# Patient Record
Sex: Female | Born: 1935 | Race: White | Hispanic: No | Marital: Married | Smoking: Never smoker
Health system: Southern US, Community
[De-identification: ages and names within clinical notes are randomized; demographics above are authoritative.]

## PROBLEM LIST (undated history)

## (undated) DIAGNOSIS — G4730 Sleep apnea, unspecified: Secondary | ICD-10-CM

## (undated) DIAGNOSIS — F0280 Dementia in other diseases classified elsewhere without behavioral disturbance: Secondary | ICD-10-CM

## (undated) DIAGNOSIS — I10 Essential (primary) hypertension: Secondary | ICD-10-CM

## (undated) DIAGNOSIS — I5032 Chronic diastolic (congestive) heart failure: Secondary | ICD-10-CM

## (undated) DIAGNOSIS — E039 Hypothyroidism, unspecified: Secondary | ICD-10-CM

---

## 2006-09-24 NOTE — Procedures (Signed)
[facility]                          STRESS ECHOCARDIOGRAM REPORT   NAME:   Beth [name], Beth [name]                                SS#:   [id number]   DOB:     11/02/1932                                     AGE:        72   SEX:     F                                           ROOM#:     OP   MR#:    [id number]                                       DATE:   09/24/2006   REFERRING PHYS:   STEVEN HARTLINE, MD                     TAPE/INDEX:   PRETEST DATA:   INDICATION:  Chest pain   MEDS TAKEN:  None   MEDS HELD:  Pravastatin; Advair; Rhinocort   TARGET HEART RATE:  143     85%:  125   RISK FACTORS:  Family history; hyperlipidemia   BASELINE ECG:  Normal sinus rhythm   EXERCISE SUPERVISED BY:  ---   TEST RESULTS:             BRUCE PROTOCOL    STAGE    SPEED (MPH)   GRADE (%)    TIME (MIN:SEC)     HR       BP   Resting                                                  72     125/60      1          1.7           10            3:00         114     142/76      2          2.5           12            3:00         131     158/72      3          3.4           14            1:00         139     ---/---      4            4.2           16            3:00         ---     ---/---      5          5.0           18            3:00         ---     ---/---      6          5.5           20            3:00         ---     ---/---      7          6.0           22            3:00         ---     ---/---   Recovery                               Immediate       ---     ---/---                                             2:00          99     149/60                                             4:00          82     147/58                                             6:00          78     128/61   REASON FOR STOPPING:  Achieved target heart rate              TOTAL   EXERCISE TIME:  7:00   ACHIEVED HEART RATE:  139 (97%  max HR)                       EST. METS:   ---    HR RESPONSE:  Normal                                          PEAK RPP:   ---   BP RESPONSE:  Normal                                          CHEST PAIN:   None   OBSERVED DYSRHYTHMIAS:    None   ST SEGMENT CHANGES:  None                                 WALL MOTION ANALYSIS   LV WALL SEGMENT             PRE-EXERCISE             POST-EXERCISE   Basal Anteroseptal             Normal                 Hyperkinesis   Basal Septal                   Normal                 Hyperkinesis   Basal Inferoseptal             Normal                 Hyperkinesis   Basal Posterior                Normal                 Hyperkinesis   Basal Lateral                  Normal                 Hyperkinesis   Basal Anterior                 Normal                 Hyperkinesis   Mid Anteroseptal               Normal                 Hyperkinesis   Mid Septal                     Normal                 Hyperkinesis   Mid Inferoseptal               Normal                 Hyperkinesis   Mid Posterior                  Normal                 Hyperkinesis   Mid Lateral                    Normal                 Hyperkinesis   Mid Anterior                   Normal                 Hyperkinesis   Apical Septal                  Normal                 Hyperkinesis   Apical Inferior                Normal                   Hyperkinesis   Apical Lateral                 Normal                 Hyperkinesis   Apical Anterior                Normal                 Hyperkinesis   LV  Chamber Size                                        Smaller   ECG INTERPRETATION:  Negative by ECG criteria.   ECHO INTERPRETATION:  Normal.   OVERALL IMPRESSION:  Negative for ischemia.   ECG AND ECHO INTERPRETATION BY :                                          JESSE W. ST. CLAIR, III, M.D.   sp1  D: 09/24/2006  [name]: 09/24/2006  1:01 P   cc:    JESSE W. ST. CLAIR, III, M.D.

## 2006-09-24 NOTE — Procedures (Signed)
[facility]                          STRESS ECHOCARDIOGRAM REPORT   NAME:   Beth [name], Beth [name]                                SS#:   [id number]   DOB:     03-Jun-1932                                     AGE:        72   SEX:     F                                           ROOM#:     OP   MR#:    [id number]                                       DATE:   09/24/2006   REFERRING PHYS:   Azucena Fallen, MD                     TAPE/INDEX:   PRETEST DATA:   INDICATION:  Chest pain   MEDS TAKEN:  None   MEDS HELD:  Pravastatin; Advair; Rhinocort   TARGET HEART RATE:  143     85%:  125   RISK FACTORS:  Family history; hyperlipidemia   BASELINE ECG:  Normal sinus rhythm   EXERCISE SUPERVISED BY:  ---   TEST RESULTS:             BRUCE PROTOCOL    STAGE    SPEED (MPH)   GRADE (%)    TIME (MIN:SEC)     HR       BP   Resting                                                  72     125/60      1          1.7           10            3:00         114     142/76      2          2.5           12            3:00         131     158/72      3          3.4           14            1:00         139     ---/---      4  4.2           16            3:00         ---     ---/---      5          5.0           18            3:00         ---     ---/---      6          5.5           20            3:00         ---     ---/---      7          6.0           22            3:00         ---     ---/---   Recovery                               Immediate       ---     ---/---                                             2:00          99     149/60                                             4:00          82     147/58                                             6:00          78     128/61   REASON FOR STOPPING:  Achieved target heart rate              TOTAL   EXERCISE TIME:  7:00   ACHIEVED HEART RATE:  139 (97%  max HR)                       EST. METS:   ---    HR RESPONSE:  Normal                                          PEAK RPP:   ---   BP RESPONSE:  Normal                                          CHEST PAIN:   None   OBSERVED DYSRHYTHMIAS:  None   ST SEGMENT CHANGES:  None                                 WALL MOTION ANALYSIS   LV WALL SEGMENT             PRE-EXERCISE             POST-EXERCISE   Basal Anteroseptal             Normal                 Hyperkinesis   Basal Septal                   Normal                 Hyperkinesis   Basal Inferoseptal             Normal                 Hyperkinesis   Basal Posterior                Normal                 Hyperkinesis   Basal Lateral                  Normal                 Hyperkinesis   Basal Anterior                 Normal                 Hyperkinesis   Mid Anteroseptal               Normal                 Hyperkinesis   Mid Septal                     Normal                 Hyperkinesis   Mid Inferoseptal               Normal                 Hyperkinesis   Mid Posterior                  Normal                 Hyperkinesis   Mid Lateral                    Normal                 Hyperkinesis   Mid Anterior                   Normal                 Hyperkinesis   Apical Septal                  Normal                 Hyperkinesis   Apical Inferior                Normal  Hyperkinesis   Apical Lateral                 Normal                 Hyperkinesis   Apical Anterior                Normal                 Hyperkinesis   LV  Chamber Size                                        Smaller   ECG INTERPRETATION:  Negative by ECG criteria.   ECHO INTERPRETATION:  Normal.   OVERALL IMPRESSION:  Negative for ischemia.   ECG AND ECHO INTERPRETATION BY :                                          Ola Spurr, M.D.   Catalina Gravel  D: 09/24/2006  T: 09/24/2006  1:01 P   cc:    Reola Mosher, III, M.D.

## 2006-09-24 NOTE — Procedures (Signed)
[facility]                          STRESS ECHOCARDIOGRAM REPORT   NAME:   Beth [name], Beth [name]                                SS#:   [id number]   DOB:     12/20/1931                                     AGE:        72   SEX:     F                                           ROOM#:     OP   MR#:    [id number]                                       DATE:   09/24/2006   REFERRING PHYS:   STEVEN HARTLINE, MD                     TAPE/INDEX:   PRETEST DATA:   INDICATION:  Chest pain   MEDS TAKEN:  None   MEDS HELD:  Pravastatin; Advair; Rhinocort   TARGET HEART RATE:  143     85%:  125   RISK FACTORS:  Family history; hyperlipidemia   BASELINE ECG:  Normal sinus rhythm   EXERCISE SUPERVISED BY:  ---   TEST RESULTS:             BRUCE PROTOCOL    STAGE    SPEED (MPH)   GRADE (%)    TIME (MIN:SEC)     HR       BP   Resting                                                  72     125/60      1          1.7           10            3:00         114     142/76      2          2.5           12            3:00         131     158/72      3          3.4           14            1:00         139     ---/---      4            4.2           16            3:00         ---     ---/---      5          5.0           18            3:00         ---     ---/---      6          5.5           20            3:00         ---     ---/---      7          6.0           22            3:00         ---     ---/---   Recovery                               Immediate       ---     ---/---                                             2:00          99     149/60                                             4:00          82     147/58                                             6:00          78     128/61   REASON FOR STOPPING:  Achieved target heart rate              TOTAL   EXERCISE TIME:  7:00   ACHIEVED HEART RATE:  139 (97%  max HR)                       EST. METS:   ---   HR RESPONSE:  Normal                                           PEAK RPP:   ---   BP RESPONSE:  Normal                                          CHEST PAIN:   None   OBSERVED DYSRHYTHMIAS:    None   ST SEGMENT CHANGES:  None                                 WALL MOTION ANALYSIS   LV WALL SEGMENT             PRE-EXERCISE             POST-EXERCISE   Basal Anteroseptal             Normal                 Hyperkinesis   Basal Septal                   Normal                 Hyperkinesis   Basal Inferoseptal             Normal                 Hyperkinesis   Basal Posterior                Normal                 Hyperkinesis   Basal Lateral                  Normal                 Hyperkinesis   Basal Anterior                 Normal                 Hyperkinesis   Mid Anteroseptal               Normal                 Hyperkinesis   Mid Septal                     Normal                 Hyperkinesis   Mid Inferoseptal               Normal                 Hyperkinesis   Mid Posterior                  Normal                 Hyperkinesis   Mid Lateral                    Normal                 Hyperkinesis   Mid Anterior                   Normal                 Hyperkinesis   Apical Septal                  Normal                 Hyperkinesis   Apical Inferior                Normal                   Hyperkinesis   Apical Lateral                 Normal                 Hyperkinesis   Apical Anterior                Normal                 Hyperkinesis   LV  Chamber Size                                        Smaller   ECG INTERPRETATION:  Negative by ECG criteria.   ECHO INTERPRETATION:  Normal.   OVERALL IMPRESSION:  Negative for ischemia.   ECG AND ECHO INTERPRETATION BY :                                          JESSE W. ST. CLAIR, III, M.D.   sp1  D: 09/24/2006  [name]: 09/24/2006  1:01 P   cc:    JESSE W. ST. CLAIR, III, M.D.

## 2006-09-24 NOTE — Procedures (Signed)
[facility]                          STRESS ECHOCARDIOGRAM REPORT   NAME:   Beth [name], Beth [name]                                SS#:   [id number]   DOB:     June 22, 1932                                     AGE:        72   SEX:     F                                           ROOM#:     OP   MR#:    [id number]                                       DATE:   09/24/2006   REFERRING PHYS:   Azucena Fallen, MD                     TAPE/INDEX:   PRETEST DATA:   INDICATION:  Chest pain   MEDS TAKEN:  None   MEDS HELD:  Pravastatin; Advair; Rhinocort   TARGET HEART RATE:  143     85%:  125   RISK FACTORS:  Family history; hyperlipidemia   BASELINE ECG:  Normal sinus rhythm   EXERCISE SUPERVISED BY:  ---   TEST RESULTS:             BRUCE PROTOCOL    STAGE    SPEED (MPH)   GRADE (%)    TIME (MIN:SEC)     HR       BP   Resting                                                  72     125/60      1          1.7           10            3:00         114     142/76      2          2.5           12            3:00         131     158/72      3          3.4           14            1:00         139     ---/---      4  4.2           16            3:00         ---     ---/---      5          5.0           18            3:00         ---     ---/---      6          5.5           20            3:00         ---     ---/---      7          6.0           22            3:00         ---     ---/---   Recovery                               Immediate       ---     ---/---                                             2:00          99     149/60                                             4:00          82     147/58                                             6:00          78     128/61   REASON FOR STOPPING:  Achieved target heart rate              TOTAL   EXERCISE TIME:  7:00   ACHIEVED HEART RATE:  139 (97%  max HR)                       EST. METS:   ---   HR RESPONSE:  Normal                                           PEAK RPP:   ---   BP RESPONSE:  Normal                                          CHEST PAIN:   None   OBSERVED DYSRHYTHMIAS:  None   ST SEGMENT CHANGES:  None                                 WALL MOTION ANALYSIS   LV WALL SEGMENT             PRE-EXERCISE             POST-EXERCISE   Basal Anteroseptal             Normal                 Hyperkinesis   Basal Septal                   Normal                 Hyperkinesis   Basal Inferoseptal             Normal                 Hyperkinesis   Basal Posterior                Normal                 Hyperkinesis   Basal Lateral                  Normal                 Hyperkinesis   Basal Anterior                 Normal                 Hyperkinesis   Mid Anteroseptal               Normal                 Hyperkinesis   Mid Septal                     Normal                 Hyperkinesis   Mid Inferoseptal               Normal                 Hyperkinesis   Mid Posterior                  Normal                 Hyperkinesis   Mid Lateral                    Normal                 Hyperkinesis   Mid Anterior                   Normal                 Hyperkinesis   Apical Septal                  Normal                 Hyperkinesis   Apical Inferior                Normal  Hyperkinesis   Apical Lateral                 Normal                 Hyperkinesis   Apical Anterior                Normal                 Hyperkinesis   LV  Chamber Size                                        Smaller   ECG INTERPRETATION:  Negative by ECG criteria.   ECHO INTERPRETATION:  Normal.   OVERALL IMPRESSION:  Negative for ischemia.   ECG AND ECHO INTERPRETATION BY :                                          Ola Spurr, M.D.   Catalina Gravel  D: 09/24/2006  T: 09/24/2006  1:01 P   cc:    Reola Mosher, III, M.D.

## 2006-10-14 NOTE — Procedures (Signed)
[facility]                           RESPIRATORY CARE DEPARTMENT                             PULMONARY FUNCTION TEST   NAME:  Beth [name], Beth [name]                           ROOM:           DATE:   10/14/2006   SS#:  [id number]                                 MR#:  [id number]   BILLING#:  [id number]   REFERRING PHYSICIAN:    Dr. Nedeem Inayat   COMMENTS:   Examination of the expiratory spirogram reveals a smooth curve and good   effort.  The forced expiratory time is greater than six seconds and the   terminal plateau is reached.   There is a slight downward concavity of the expiratory side of the flow   volume loop suggestive of early air flow obstruction.   Forced vital capacity and FEV1 are normal.  The ratio of FEV1/FVC is at the   lower limits of normal. Maximal voluntary ventilation is normal.  There is   no change in measured flow parameters after administration of inhaled   bronchodilator.   Total lung capacity is normal.  Functional residual capacity and residual   volume are both moderately increased.  The ratio of RV/TLC is minimally   increased.   Airway resistance is 1-1/2 times normal.   Diffusion capacity for carbon monoxide corrected for hemoglobin of 12.2 and   a normal carboxy hemoglobin level is moderately reduced.   This implies a   loss of pulmonary vascular bed and/or parenchyma.   IMPRESSION:      1. Normal forced vital capacity without evidence of airflow obstruction         by GOLD criteria.      2. Normal TLC with slight increased RV/TLC ratio.      3. Moderate reduction of diffusion capacity implying loss of pulmonary         vascular bed and/or parenchyma.   ___________________________________________   STEVEN H. KAUFMAN MD, M.D.   ak  D: 10/14/2006  T: 10/14/2006  7:21 P   [id number]   cc:   STEVEN H. KAUFMAN MD, M.D.

## 2006-10-14 NOTE — Procedures (Signed)
[facility]                           RESPIRATORY CARE DEPARTMENT                             PULMONARY FUNCTION TEST   NAME:  [name], Beth                           ROOM:           DATE:   10/14/2006   SS#:  [id number]                                 MR#:  [id number]   BILLING#:  [id number]   REFERRING PHYSICIAN:    Dr. Nicolasa Ducking   COMMENTS:   Examination of the expiratory spirogram reveals a smooth curve and good   effort.  The forced expiratory time is greater than six seconds and the   terminal plateau is reached.   There is a slight downward concavity of the expiratory side of the flow   volume loop suggestive of early air flow obstruction.   Forced vital capacity and FEV1 are normal.  The ratio of FEV1/FVC is at the   lower limits of normal. Maximal voluntary ventilation is normal.  There is   no change in measured flow parameters after administration of inhaled   bronchodilator.   Total lung capacity is normal.  Functional residual capacity and residual   volume are both moderately increased.  The ratio of RV/TLC is minimally   increased.   Airway resistance is 1-1/2 times normal.   Diffusion capacity for carbon monoxide corrected for hemoglobin of 12.2 and   a normal carboxy hemoglobin level is moderately reduced.   This implies a   loss of pulmonary vascular bed and/or parenchyma.   IMPRESSION:      1. Normal forced vital capacity without evidence of airflow obstruction         by GOLD criteria.      2. Normal TLC with slight increased RV/TLC ratio.      3. Moderate reduction of diffusion capacity implying loss of pulmonary         vascular bed and/or parenchyma.   ___________________________________________   Thornton Dales MD, M.D.   ak  D: 10/14/2006  T: 10/14/2006  7:21 P   [id number]   cc:   Thornton Dales MD, M.D.

## 2016-03-27 ENCOUNTER — Inpatient Hospital Stay
Admit: 2016-03-27 | Discharge: 2016-03-27 | Disposition: A | Discharge status: Home / Self Care | Payer: MEDICARE | Attending: Emergency Medical Services

## 2016-03-27 ENCOUNTER — Emergency Department: Admit: 2016-03-27 | Payer: MEDICARE | Primary: Family Medicine

## 2016-03-27 DIAGNOSIS — R55 Syncope and collapse: Secondary | ICD-10-CM

## 2016-03-27 LAB — EKG, 12 LEAD, INITIAL
Atrial Rate: 75 {beats}/min
Calculated P Axis: 72 degrees
Calculated R Axis: 14 degrees
Calculated T Axis: 41 degrees
P-R Interval: 160 ms
Q-T Interval: 390 ms
QRS Duration: 76 ms
QTC Calculation (Bezet): 435 ms
Ventricular Rate: 75 {beats}/min

## 2016-03-27 LAB — POC URINE MACROSCOPIC
Bilirubin: NEGATIVE
Glucose: NEGATIVE mg/dl
Ketone: NEGATIVE mg/dl
Nitrites: NEGATIVE
Specific gravity: 1.015 (ref 1.005–1.030)
Urobilinogen: 0.2 EU/dl (ref 0.0–1.0)
pH (UA): 7 (ref 5–9)

## 2016-03-27 LAB — METABOLIC PANEL, COMPREHENSIVE
ALT (SGPT): 20 U/L (ref 12–78)
AST (SGOT): 22 U/L (ref 15–37)
Albumin: 3.5 gm/dl (ref 3.4–5.0)
Alk. phosphatase: 97 U/L (ref 45–117)
BUN: 15 mg/dl (ref 7–25)
Bilirubin, total: 0.4 mg/dl (ref 0.2–1.0)
CO2: 27 mEq/L (ref 21–32)
Calcium: 9 mg/dl (ref 8.5–10.1)
Chloride: 103 mEq/L (ref 98–107)
Creatinine: 1.3 mg/dl (ref 0.6–1.3)
GFR est AA: 51
GFR est non-AA: 42
Glucose: 172 mg/dl — ABNORMAL HIGH (ref 74–106)
Potassium: 4.1 mEq/L (ref 3.5–5.1)
Protein, total: 7.6 gm/dl (ref 6.4–8.2)
Sodium: 138 mEq/L (ref 136–145)

## 2016-03-27 LAB — CBC WITH AUTOMATED DIFF
BASOPHILS: 0.7 % (ref 0–3)
EOSINOPHILS: 1.2 % (ref 0–5)
HCT: 37.1 % (ref 37.0–50.0)
HGB: 12.4 gm/dl — ABNORMAL LOW (ref 13.0–17.2)
IMMATURE GRANULOCYTES: 0.4 % (ref 0.0–3.0)
LYMPHOCYTES: 19.3 % — ABNORMAL LOW (ref 28–48)
MCH: 30.1 pg (ref 25.4–34.6)
MCHC: 33.4 gm/dl (ref 30.0–36.0)
MCV: 90 fL (ref 80.0–98.0)
MONOCYTES: 9.1 % (ref 1–13)
MPV: 10.2 fL — ABNORMAL HIGH (ref 6.0–10.0)
NEUTROPHILS: 69.3 % — ABNORMAL HIGH (ref 34–64)
NRBC: 0 (ref 0–0)
PLATELET: 298 10*3/uL (ref 140–450)
RBC: 4.12 M/uL (ref 3.60–5.20)
RDW-SD: 40.7 (ref 36.4–46.3)
WBC: 8.4 10*3/uL (ref 4.0–11.0)

## 2016-03-27 LAB — TROPONIN I: Troponin-I: <0.015 ng/ml (ref 0.000–0.045)

## 2016-03-27 MED ORDER — SODIUM CHLORIDE 0.9% BOLUS IV
0.9 % | INTRAVENOUS | Status: AC
Start: 2016-03-27 — End: 2016-03-27
  Administered 2016-03-27: 16:00:00 via INTRAVENOUS

## 2016-03-27 MED ORDER — SODIUM CHLORIDE 0.9% BOLUS IV
0.9 % | Freq: Once | INTRAVENOUS | Status: DC
Start: 2016-03-27 — End: 2016-03-27

## 2016-03-27 NOTE — Progress Notes (Signed)
CM was asked by Md to see pt. Dr. Cipriano MilePitrolo would like to admit pt to observation, to find out what may have caused her syncopal episode. Husband and pt  do not want to be admitted to observation, due to possible hospital bill they cannot pay. CM attempted to find out what costs might be for them if she was admitted to observation. Spoke with billing they were unable to tell this CM cost as it would depend on why she was admitted, what procedures and other testing was done. That would be after she was discharged. CM spoke with pt and husband in room. Wife wants to go home, states she did not want to come to hospital. CM explained benefits of staying verses what could happen if she went home. Pt states she is willing to take that chance. Per pt she is 82 years old and if this is the end then I am fine with that. Husband agrees with pt. CM spoke with Dr. Cipriano MilePitrolo informed him of conversation with pt and husband.   Juanetta Beetsonstance Voodre RN

## 2016-03-27 NOTE — ED Notes (Signed)
Spoke w/ JC whitley from lab and informed him that urine needs to be run upstairs.  Will follow up

## 2016-03-27 NOTE — ED Notes (Signed)
Pt arrived via ems c/o dizziness and syncopal episodes this am x 2; 1st was while she was [place] [employer] and 2nd when she tried to stand back up w/ no note of injury at this time. Pt reports diarrhea x 2 this am w/ hx of chronic constipation and took milk of mag last night.  No c/o n/v, fever, neuro changes, sob, or pain/injury at this time.

## 2016-03-27 NOTE — ED Provider Notes (Signed)
[place]  Emergency Department Treatment Report    Patient: Beth [name] Age: 82 y.o. Sex: female    Date of Birth: [birth year] Admit Date: 03/27/2016 PCP: Jerline Pain, MD   MRN: [id number]  CSN: [id number]     Room: ER38/ER38 Time Dictated: 12:04 PM      Chief Complaint   Chief Complaint   Patient presents with   ??? Syncope     Syncope x 2 episodes w/ diarrhea x today         History of Present Illness   82 y.o. female presents to emergency department with syncope ??2 brought in by paramedics. She states that she was walking up the steps at the [place] when she began to feel tired and fatigued. She does not remember anything more other than waking up on the ground was found by multiple people. She was told that she had been not responding for several minutes. She was assisted to a standing position and had a recurrent episode of syncope. In route to the hospital paramedics found blood sugar of 136. There are no reports of seizure like movements seen from witnesses at the scene. She denies history of syncopal episodes in the past, known cardiac disease or history of cardiac arrhythmias. Patient does have hypothyroidism and hyperlipidemia for which takes Synthroid and pravastatin and "memory pill" that she often forgets to take. She does admit that she's been having several days of constipation to recommend magnesium last night with 2 episodes of diarrhea today. She denies blood or black in her bowel movements, anticoagulation use or history of GI bleeds. She denies experiencing chest pain, shortness of breath, cough or palpitations. She denies fevers, nausea or vomiting, urinary symptoms, abdominal pain, chest pain, back pain. She denies any recent change in vision, trouble with speech or ambulation weakness or paresthesias in her upper or lower extremities. She lives at home with her husband and follows with Dr. Letitia Neri as her PCP. She denies pain or injury from fall. She  states that she doesn't quite feel back to normal at this time, describing generalized weakness.    Review of Systems   Constitutional: No fever, chills, or weight loss. + weakness.   Eyes: No blurred vision, double vision.  ENT: No sore throat, congestion or ear pain.  Respiratory: No cough, shortness of breath or wheezing.  Cardiovascular:+ syncope. No chest pain/pressure or palpitations.   Gastrointestinal: +2 loose bm today, no melena, hematochezia, or hematemesis. No adominal pain. No nausea, vomiting.  Genitourinary: No painful urination, frequency, or urgency.  Musculoskeletal: No joint pain or swelling. No boney pain or injury.   Integumentary: No lacerations or abrasions.   Neurological: No headache or dizziness. No extremity weakness or paresthesias.     Past Medical/Surgical History     Past Medical History:   Diagnosis Date   ??? Chronic constipation    ??? Hyperlipidemia    ??? Hypothyroid    ??? Miscarriage      History reviewed. No pertinent surgical history.  Denies surgeries    Social History     Social History     Social History   ??? Marital status: MARRIED     Spouse name: N/A   ??? Number of children: N/A   ??? Years of education: N/A     Social History Main Topics   ??? Smoking status: Never Smoker   ??? Smokeless tobacco: None   ??? Alcohol use No   ??? Drug use:  No   ??? Sexual activity: Not Asked     Other Topics Concern   ??? None     Social History Narrative   ??? None     Family History     Family History   Problem Relation Age of Onset   ??? Family history unknown: Yes     Home Medications     Prior to Admission Medications   Prescriptions Last Dose Informant Patient Reported? Taking?   levothyroxine (SYNTHROID) 88 mcg tablet   Yes Yes   Sig: Take 88 mcg by mouth Daily (before breakfast).   pravastatin (PRAVACHOL) 10 mg tablet   Yes Yes   Sig: Take 10 mg by mouth nightly.      Facility-Administered Medications: None     Allergies   No Known Allergies    Physical Exam   ED Triage Vitals   Enc Vitals Group       BP 03/27/16 1102 117/63      Pulse (Heart Rate) 03/27/16 1102 76      Resp Rate 03/27/16 1102 18      Temp 03/27/16 1102 97.9 ??F (36.6 ??C)      Temp src --       O2 Sat (%) 03/27/16 1102 100 %      Weight 03/27/16 1102 119 lb      Height 03/27/16 1102 5' 3"       Head Cir --       Peak Flow --       Pain Score --       Pain Loc --       Pain Edu? --       Excl. in [place]? --      General: Patient appears well developed and well nourished.   HEENT: Conjunctiva clear. PERRL. EOMs intact. TMs clear without injection or hemotympanum. Mucous membranes moist, non-erythematous. No battle sign, raccoon eyes, step-off deformity, or scalp contusion.  Neck: No spinal midline tenderness to palpation. Supple, symmetrical. Mild lateral discomfort with range of motion of the neck, without midline TTP.  Respiratory: Lungs clear to auscultation, nonlabored respirations. No wheezes, rhonchi, or rales.  Cardiovascular: Heart regular rate and rhythm without murmur, rubs or gallops.   Gastrointestinal:  Abdomen soft, nondistended. No abdominal tenderness to palpation. No ecchymosis on the abdomen or back. No CVA tenderness to palpation.  Musculoskeletal: No pain to palpation or with movement of shoulder, elbow, wrist, hip, knee, ankle bilaterally. No pain to palpation pelvis and entire spinal midline.  Integumentary: No evidence of contusion, laceration, abrasion or ecchymosis seen. Warm and dry without rashes or lesions.  Neurologic: Alert and oriented to name person, place, date of birth, able to name [place] president, name objects in the room, spell world backwards. No facial asymmetry or dysarthria noted. Finger to nose coordination intact bilaterally. Rapid alternating movements with hand flip and heal to shin intact bilaterally. Upper extremity and lower extremity sensation and strength intact bilaterally. +2 over 4 DTR patella bilaterally. Able to move all extremities.  Impression and Management Plan    This is a previously grossly healthy 82 year old female who has experienced 2 episodes of syncope as they're today. She has no known cardiac history or prior cardiac enzymes and EKG and continue to monitor her on the cardiac monitor for evidence of arrhythmias. We will additionally obtain labs to look for subtle evidence of anemia and renal failure collection White abnormalities. We will obtain chest x-ray and urinalysis to look for source of  infection. She has no abdominal or back pain, is not on anticoagulation and not high risk for AAA. She has no head pain or objective neurologic findings to concern for bleed following head injury. At this time we will not image her head for evidence of hemorrhage. We will obtain orthos and give 1 liter of fluids, due to no history of congestive heart failure.  Diagnostic Studies   Lab:   Recent Results (from the past 12 hour(s))   EKG, 12 LEAD, INITIAL    Collection Time: 03/27/16 11:24 AM   Result Value Ref Range    Ventricular Rate 75 BPM    Atrial Rate 75 BPM    P-R Interval 160 ms    QRS Duration 76 ms    Q-T Interval 390 ms    QTC Calculation (Bezet) 435 ms    Calculated P Axis 72 degrees    Calculated R Axis 14 degrees    Calculated T Axis 41 degrees    Diagnosis       Baseline Artifact  Baseline wander  Normal sinus rhythm  Normal ECG  No previous ECGs available  Confirmed by Marrion Coy, M.D., Ellin Saba. ([id number]) on 03/27/2016 12:53:36 PM     TROPONIN I    Collection Time: 03/27/16  1:05 PM   Result Value Ref Range    Troponin-I <0.015 0.000 - 0.045 ng/ml   CBC WITH AUTOMATED DIFF    Collection Time: 03/27/16  1:05 PM   Result Value Ref Range    WBC 8.4 4.0 - 11.0 1000/mm3    RBC 4.12 3.60 - 5.20 M/uL    HGB 12.4 (L) 13.0 - 17.2 gm/dl    HCT 37.1 37.0 - 50.0 %    MCV 90.0 80.0 - 98.0 fL    MCH 30.1 25.4 - 34.6 pg    MCHC 33.4 30.0 - 36.0 gm/dl    PLATELET 298 140 - 450 1000/mm3    MPV 10.2 (H) 6.0 - 10.0 fL    RDW-SD 40.7 36.4 - 46.3      NRBC 0 0 - 0       IMMATURE GRANULOCYTES 0.4 0.0 - 3.0 %    NEUTROPHILS 69.3 (H) 34 - 64 %    LYMPHOCYTES 19.3 (L) 28 - 48 %    MONOCYTES 9.1 1 - 13 %    EOSINOPHILS 1.2 0 - 5 %    BASOPHILS 0.7 0 - 3 %   METABOLIC PANEL, COMPREHENSIVE    Collection Time: 03/27/16  1:05 PM   Result Value Ref Range    Sodium 138 136 - 145 mEq/L    Potassium 4.1 3.5 - 5.1 mEq/L    Chloride 103 98 - 107 mEq/L    CO2 27 21 - 32 mEq/L    Glucose 172 (H) 74 - 106 mg/dl    BUN 15 7 - 25 mg/dl    Creatinine 1.3 0.6 - 1.3 mg/dl    GFR est AA 51.0      GFR est non-AA 42      Calcium 9.0 8.5 - 10.1 mg/dl    AST (SGOT) 22 15 - 37 U/L    ALT (SGPT) 20 12 - 78 U/L    Alk. phosphatase 97 45 - 117 U/L    Bilirubin, total 0.4 0.2 - 1.0 mg/dl    Protein, total 7.6 6.4 - 8.2 gm/dl    Albumin 3.5 3.4 - 5.0 gm/dl     Urine dip in the ED negative for nitrates,  small leukocytes, trace protein, trace intact blood, neg glucose, neg ketones, spec gravity 1.015    EKG- NSR without acute ST/T wave abnormality    Imaging:    Xr Chest Pa Lat    Result Date: 03/27/2016  Indication: Syncopal episode. Hypothyroid.     IMPRESSION: Right middle lobe and lingular bronchiectasis. Comment: PA and lateral views the chest reveal the lungs are hyperaerated. Bronchiectasis is present in the right middle lobe and the lingula. No pulmonary consolidation. The heart is of normal size.     ED Course/ Medical Decision Making   Patient was orthostatic with HR increase from 74 to 116 bpm and blood pressure from 127/64 to 97/73. We will give a total of 1.5 liters in the ED. her Emergency department is negative for evidence of acute infection, renal failure, electrocardiogram abnormalities, cardiac injury based upon cardiac enzymes and EKG. She has been up ambulating to the restroom. Patient was monitored in the emergency department on cardiac monitor without arrhythmia noted. She is overall feeling well. Multiple discussions with ER attending recommending overnight observation and  evaluation due to 2 syncopal episodes at prior to arrival to the emergency department. Patient was informed of risk of cardiac arrhythmias, heart attack and death. Patient is of sound mind with husband present during discussion, both understanding the risk and patient is willing to sign AMA paperwork to leave [place].  Final Diagnosis       ICD-10-CM ICD-9-CM   1. Syncope and collapse R55 780.2       Disposition   Left AMA      The patient was personally evaluated by myself and Dr. Evonnie Pat who agrees with the above assessment and plan    Eliseo Gum, PA  Mar 27, 2016    My signature above authenticates this document and my orders, the final ??  diagnosis (es), discharge prescription (s), and instructions in the Epic ??  record.  If you have any questions please contact [phone].  ??  Nursing notes have been reviewed by the physician/ advanced practice ??  Clinician.

## 2016-03-27 NOTE — Other (Signed)
Urine contamiante. Patient informed. She will follow up with PCP. Chart reviewed by ChadBrittany Sossong PA-C.

## 2016-03-27 NOTE — ED Notes (Addendum)
3:27 PM  03/27/16     Discharge instructions given to patient by Deanna ArtisMichelle Wade, RN (name) with verbalization of understanding. Pt leaving AMA- Dr. Cipriano MilePitrolo already consulted w/ pt and AMA form signed.  Patient accompanied by husband.  Patient discharged with the following prescriptions (none).  Patient discharged to home and taken off unit via w/c without incident   Teofilo PodMegan Kinlaw, RN

## 2016-03-28 LAB — POC URINE MICROSCOPIC

## 2016-03-29 LAB — CULTURE, URINE
CULTURE RESULT: >100000 — AB
Culture result: >100000 — AB

## 2017-05-16 ENCOUNTER — Inpatient Hospital Stay: Admit: 2017-05-16 | Payer: MEDICARE | Primary: Family Medicine

## 2017-05-16 DIAGNOSIS — M542 Cervicalgia: Secondary | ICD-10-CM
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
## 2017-05-24 ENCOUNTER — Inpatient Hospital Stay
Admit: 2017-05-24 | Payer: MEDICARE | Attending: Rehabilitative and Restorative Service Providers" | Primary: Family Medicine

## 2017-05-24 NOTE — Progress Notes (Signed)
PT DAILY TREATMENT NOTE 8-14    Patient Name: Beth [name]  Date:05/24/2017  DOB: [birth year]  [x]   Patient DOB Verified  Payor: AETNA MEDICARE / Plan: [facility] AETNA MEDICARE ADVANTAGE / Product Type: Managed Care Medicare /    In time:857am  Out time:955  Total Treatment Time (min): 58  Total Timed Codes (min): 53  1:1 Treatment Time (min): 53   Visit #: 2 of 8-10    Treatment Area: Cervicalgia [M54.2]    SUBJECTIVE  Pain Level (0-10 scale): 4  Any medication changes, allergies to medications, adverse drug reactions, diagnosis change, or new procedure performed?: [x]  No    []  Yes (see summary sheet for update)  Subjective functional status/changes:   []  No changes reported  "I have done a few exercises'    OBJECTIVE  Modality rationale: decrease inflammation, decrease pain, increase tissue extensibility and increase muscle contraction/control to improve the patient???s ability to perform functional mobility and improve activity  endurance     Min Type Additional Details    []  Estim: [] Att   [] Unatt        [] TENS instruct                  [] IFC  [] Premod   [] NMES                     [] Other:  [] w/US   [] w/ice   [] w/heat  Position:  Location:    []   Traction: []  Cervical       [] Lumbar                       []  Prone          [] Supine                       [] Intermittent   [] Continuous Lbs:  []  before manual  []  after manual    []   Ultrasound: [] Continuous   []  Pulsed                           []   [] Location:  W/cm2:    []   Iontophoresis with dexamethasone         Location: []  Take home patch   []  In clinic   10 []   Ice     [x]   heat  []   Ice massage Position:reclined  Location: c/s    []   Vasopneumatic Device Pressure:       []  lo []  med []  hi   Temperature: []  lo []  med []  hi   []  Skin assessment post-treatment:  [] intact [] redness- no adverse reaction       [] redness ??? adverse reaction:       33/29 min Therapeutic Exercise:  []  See flow sheet :    Rationale: increase ROM, increase strength and improve coordination to improve the patient???s ability to perform functional mobility and improve activity  endurance       15 min Manual Therapy:  SOR, PROM all planes, MFR to R UT/Scalenes   Rationale: decrease pain, increase ROM, increase tissue extensibility, decrease trigger points and increase postural awareness to allow for full c/s AROM for ADLs          x min Patient Education: [x]  Review HEP    []  Progressed/Changed HEP based on:   []  positioning   [x]  body mechanics   []  transfers   []   heat/ice application        Other Objective/Functional Measures:   Initiated POC per protocol  Limitations in L rot and R SB limited  Add Lateral isometrics NV     Pain Level (0-10 scale) post treatment: 2-3    ASSESSMENT/Changes in Function:   Patient did well with initial session with frequent VC/TC for form and decreased UT compensation.Encouraged pt with HEP compliance.    Patient will continue to benefit from skilled PT services to modify and progress therapeutic interventions, address functional mobility deficits, address ROM deficits, address strength deficits, analyze and address soft tissue restrictions, analyze and cue movement patterns, analyze and modify body mechanics/ergonomics, assess and modify postural abnormalities and address imbalance/dizziness to attain remaining goals.     []   See Plan of Care  []   See progress note/recertification  []   See Discharge Summary         Progress towards goals / Updated goals:  ??????Short Term Goals: To be accomplished in ??3????treatments  1. Patient will demonstrate I and compliance with HEP to increase Cervical Spine ROM and scapular strength. " I did a few exercises" encourage pt with daily compliance. 05/24/17  2. Patient will report decreased pain no greater than 1/10 with activity to allow increased tolerance to activity.   ??  ??                 Long Term Goals: To be accomplished in ??4????weeks:   3. Patient will increase Cervical spine AROM to WNL to promote normalized posture  4. Patient will increase Postural musculature strength to 5/5 to allow for return to PLOF  5. Patient will increase FOTO scores from 49/100 to??63/100 to demonstrate increased functional independence.  ??    PLAN  []   Upgrade activities as tolerated     []   Continue plan of care  []   Update interventions per flow sheet       []   Discharge due to:_  []   Other:_      Norris CrossNicole D Shalae Belmonte, PT 05/24/2017  7:09 AM

## 2017-05-27 ENCOUNTER — Inpatient Hospital Stay: Payer: MEDICARE | Attending: Rehabilitative and Restorative Service Providers" | Primary: Family Medicine

## 2017-05-27 DIAGNOSIS — M542 Cervicalgia: Secondary | ICD-10-CM

## 2017-05-31 ENCOUNTER — Inpatient Hospital Stay
Admit: 2017-05-31 | Payer: MEDICARE | Attending: Rehabilitative and Restorative Service Providers" | Primary: Family Medicine

## 2017-05-31 NOTE — Progress Notes (Signed)
PT DAILY TREATMENT NOTE 8-14    Patient Name: Beth [name]  Date:05/31/2017  DOB: 1932/05/17  [x]   Patient DOB Verified  Payor: AETNA MEDICARE / Plan: [facility] AETNA MEDICARE ADVANTAGE / Product Type: Managed Care Medicare /    In time: 902am  Out time: 1000am  Total Treatment Time (min): 58  Total Timed Codes (min): 44  1:1 Treatment Time (min): 25   Visit #: 3 of 8-10    Treatment Area: Cervicalgia [M54.2]    SUBJECTIVE  Pain Level (0-10 scale): 4  Any medication changes, allergies to medications, adverse drug reactions, diagnosis change, or new procedure performed?: [x]  No    []  Yes (see summary sheet for update)  Subjective functional status/changes:   []  No changes reported  "Some days are better than others. Today I am having pain in the forehead and down into my neck "    OBJECTIVE  Modality rationale: decrease inflammation, decrease pain, increase tissue extensibility and increase muscle contraction/control to improve the patient???s ability to perform functional mobility and improve activity  endurance     Min Type Additional Details    []  Estim: [] Att   [] Unatt        [] TENS instruct                  [] IFC  [] Premod   [] NMES                     [] Other:  [] w/US   [] w/ice   [] w/heat  Position:  Location:    []   Traction: []  Cervical       [] Lumbar                       []  Prone          [] Supine                       [] Intermittent   [] Continuous Lbs:  []  before manual  []  after manual    []   Ultrasound: [] Continuous   []  Pulsed                           []   [] Location:  W/cm2:    []   Iontophoresis with dexamethasone         Location: []  Take home patch   []  In clinic   10 []   Ice     [x]   heat  []   Ice massage Position:reclined  Location: c/s    []   Vasopneumatic Device Pressure:       []  lo []  med []  hi   Temperature: []  lo []  med []  hi   []  Skin assessment post-treatment:  [] intact [] redness- no adverse reaction       [] redness ??? adverse reaction:        28/10 min Therapeutic Exercise:  []  See flow sheet :   Rationale: increase ROM, increase strength and improve coordination to improve the patient???s ability to perform functional mobility and improve activity  endurance       15 min Manual Therapy:  SOR, PROM all planes, MFR to R UT/Scalenes, and R SCM    Rationale: decrease pain, increase ROM, increase tissue extensibility, decrease trigger points and increase postural awareness to allow for full c/s AROM for ADLs          x min Patient Education: [x]  Review HEP    []   Progressed/Changed HEP based on:   []  positioning   [x]  body mechanics   []  transfers   []  heat/ice application        Other Objective/Functional Measures:     Continues with Limitations in L rot and R SB limited  Add Lateral isometrics with good tolerance today   Improved AROM following MT    Pain Level (0-10 scale) post treatment: 2-3    ASSESSMENT/Changes in Function:   Patient responded well to MT with decreased c/o HA and neck pain following treatment. Noted SO TrP B and SCM TrP on R with release performed with fair tolerance. Continue to reinforce HEP participation, added retro shoulder rolls for relaxation of UT.    Patient will continue to benefit from skilled PT services to modify and progress therapeutic interventions, address functional mobility deficits, address ROM deficits, address strength deficits, analyze and address soft tissue restrictions, analyze and cue movement patterns, analyze and modify body mechanics/ergonomics, assess and modify postural abnormalities and address imbalance/dizziness to attain remaining goals.     []   See Plan of Care  []   See progress note/recertification  []   See Discharge Summary         Progress towards goals / Updated goals:  ??????Short Term Goals: To be accomplished in ??3????treatments  1. Patient will demonstrate I and compliance with HEP to increase Cervical Spine ROM and scapular strength. " I did a few exercises" encourage pt  with daily compliance. 05/24/17  2. Patient will report decreased pain no greater than 1/10 with activity to allow increased tolerance to activity.   ??  ??                 Long Term Goals: To be accomplished in ??4????weeks:  3. Patient will increase Cervical spine AROM to WNL to promote normalized posture  4. Patient will increase Postural musculature strength to 5/5 to allow for return to PLOF  5. Patient will increase FOTO scores from 49/100 to??63/100 to demonstrate increased functional independence.  ??    PLAN  []   Upgrade activities as tolerated     []   Continue plan of care  []   Update interventions per flow sheet       []   Discharge due to:_  []   Other:_      Norris CrossNicole D Dekendrick Uzelac, PT 05/31/2017  7:09 AM

## 2017-06-04 ENCOUNTER — Inpatient Hospital Stay: Admit: 2017-06-04 | Payer: MEDICARE | Primary: Family Medicine

## 2017-06-04 NOTE — Progress Notes (Signed)
PT DAILY TREATMENT NOTE 8-14    Patient Name: Beth [name]  Date:06/04/2017  DOB: [birth year]  [x]   Patient DOB Verified  Payor: Monia PouchAETNA MEDICARE / Plan: [facility] AETNA MEDICARE ADVANTAGE / Product Type: Managed Care Medicare /    In time:1234  Out time:120  Total Treatment Time (min): 46  Total Timed Codes (min): 38  1:1 Treatment Time (min): 15   Visit #: 4 of 8    Treatment Area: Cervicalgia [M54.2]    SUBJECTIVE  Pain Level (0-10 scale): 1/10  Any medication changes, allergies to medications, adverse drug reactions, diagnosis change, or new procedure performed?: [x]  No    []  Yes (see summary sheet for update)  Subjective functional status/changes:   []  No changes reported  Patient reports inconsistent compliance with HEP     OBJECTIVE  Modality rationale: decrease pain and increase tissue extensibility to improve the patient???s ability to perform AROM of cervical spine for ADLs.    Min Type Additional Details    []  Estim: [] Att   [] Unatt        [] TENS instruct                  [] IFC  [] Premod   [] NMES                     [] Other:  [] w/US   [] w/ice   [] w/heat  Position:  Location:    []   Traction: []  Cervical       [] Lumbar                       []  Prone          [] Supine                       [] Intermittent   [] Continuous Lbs:  []  before manual  []  after manual    []   Ultrasound: [] Continuous   []  Pulsed                           [] 1MHz   [] 3MHz Location:  W/cm2:    []   Iontophoresis with dexamethasone         Location: []  Take home patch   []  In clinic   8 []   Ice     [x]   heat  []   Ice massage Position:supine w 2 towels.   Location:    []   Vasopneumatic Device Pressure:       []  lo []  med []  hi   Temperature: []  lo []  med []  hi   [x]  Skin assessment post-treatment:  [x] intact [] redness- no adverse reaction       [] redness ??? adverse reaction:       23/19 min Therapeutic Exercise:  [x]  See flow sheet : 4 min warm up. 0 1:1   Rationale: increase ROM and increase strength to improve the patient???s  ability to perform ADLs without increased pain       15 min Manual Therapy:  Supine SOR, DTM and Myofascial release to Right scalenes, Right SCM. PROM / Stretching into rotation and lateral flexion.    Rationale: decrease pain, increase ROM, increase tissue extensibility, decrease trigger points and increase postural awareness to perform ADLs with ease.              min Patient Education: [x]  Review HEP    []  Progressed/Changed HEP based on:   []  positioning   []   body mechanics   []  transfers   []  heat/ice application        Other Objective/Functional Measures:   No palpable TrP along Upper Traps.   Continued increased tension along Right Scalenes and Rt SCM  Poor tolerance to Moist heat pack. DC heat as no therapeutic benefit.     Pain Level (0-10 scale) post treatment: 1/10    ASSESSMENT/Changes in Function: Patient is progressing with decreased palpable TrP in UTs and increased ability to tolerate manual therapy without increase in muscular guarding.     Patient will continue to benefit from skilled PT services to modify and progress therapeutic interventions, address ROM deficits, address strength deficits, analyze and address soft tissue restrictions and assess and modify postural abnormalities to attain remaining goals.     []   See Plan of Care  []   See progress note/recertification  []   See Discharge Summary         Progress towards goals / Updated goals:  ??????Short Term Goals: To be accomplished in ??3????treatments  1. Patient will demonstrate I and compliance with HEP to increase Cervical Spine ROM and scapular strength. " I did a few exercises" encourage pt with daily compliance. 05/24/17  2. Patient will report decreased pain no greater than 1/10 with activity to allow increased tolerance to activity. Progressing with decreased reports of pain 1/10 post manual 06/04/17   ????  ????????????????????????????????????Long Term Goals: To be accomplished in ??4????weeks:   3. Patient will increase Cervical spine AROM to WNL to promote normalized posture. Progressing with increased ability to perform Therex. 06/04/17   4. Patient will increase Postural musculature strength to 5/5 to allow for return to PLOF  5. Patient will increase FOTO scores from 49/100 to??63/100 to demonstrate increased functional independence.  ????    PLAN  [x]   Upgrade activities as tolerated     []   Continue plan of care  []   Update interventions per flow sheet       []   Discharge due to:_  [x]   Other:_  Add scapular strengthening therex and scalene stretch NS if tolerated.     Dayle Points, PT 06/04/2017  2:36 PM

## 2017-06-07 ENCOUNTER — Inpatient Hospital Stay: Admit: 2017-06-07 | Payer: MEDICARE | Primary: Family Medicine

## 2017-06-07 NOTE — Progress Notes (Signed)
PT DAILY TREATMENT NOTE 8-14    Patient Name: Beth [name]  Date:06/07/2017  DOB: November 22, 1932  [x]   Patient DOB Verified  Payor: AETNA MEDICARE / Plan: [facility] AETNA MEDICARE ADVANTAGE / Product Type: Managed Care Medicare /    In time:900  Out time:955  Total Treatment Time (min): 55  Total Timed Codes (min): 55  1:1 Treatment Time (min): 25   Visit #: 5 of 8    Treatment Area: Cervicalgia [M54.2]    SUBJECTIVE  Pain Level (0-10 scale): 0/10  Any medication changes, allergies to medications, adverse drug reactions, diagnosis change, or new procedure performed?: [x]  No    []  Yes (see summary sheet for update)  Subjective functional status/changes:   []  No changes reported  Patient reports no pain at beginning of therapy session. Many questions about time it will take to "feel better".     OBJECTIVE    40/35 min Therapeutic Exercise:  [x]  See flow sheet : 5 min warm up. 10 min 1:1   Rationale: increase ROM and increase strength to improve the patient???s ability to tolerate ADLs w ease     15 min Manual Therapy:  Supine SOR, myofascial release to cervical paraspinals, Rt Scalenes, Rt SCM and PROM / Stretching into rotation and lateral flexion. Supine OA nods and cervical retraction.    Rationale: decrease pain, increase ROM, increase tissue extensibility and decrease trigger points to increase ease of performing reaching into shelf and ADLs     min Gait Training:  ___ feet with ___ device on level surfaces with ___ level of assist   Rationale:           min Patient Education: [x]  Review HEP    []  Progressed/Changed HEP based on:   []  positioning   []  body mechanics   []  transfers   []  heat/ice application        Other Objective/Functional Measures:   Patient reports increased pain with scalenes stretch.   Improved tolerance to manual PT with no guarding.   Pain Level (0-10 scale) post treatment: 5/10    ASSESSMENT/Changes in Function: Patient tolerated new therex fair with c/o  pain w scalene stretch. Patient is making slow progress towards goals with increased ease of motion and decreased reports of pain. Will Continue to address scapular and postural dysfunction through therex and manual PT. PT discussed the importance of being compliant with HEP to decrease pain and spasms/TrP.   Patient will continue to benefit from skilled PT services to modify and progress therapeutic interventions, address functional mobility deficits, address ROM deficits, address strength deficits, analyze and address soft tissue restrictions and assess and modify postural abnormalities to attain remaining goals.     []   See Plan of Care  []   See progress note/recertification  []   See Discharge Summary         Progress towards goals / Updated goals:  ??????Short Term Goals: To be accomplished in ??3????treatments  1. Patient will demonstrate I and compliance with HEP to increase Cervical Spine ROM and scapular strength. " I did a few exercises" encourage pt with daily compliance. 05/24/17  2. Patient will report decreased pain no greater than 1/10 with activity to allow increased tolerance to activity. Progressing with decreased reports of pain 1/10 post manual 06/04/17   ????  ????????????????????????????????????Long Term Goals: To be accomplished in ??4????weeks:  3. Patient will increase Cervical spine AROM to WNL to promote normalized posture. Progressing with increased ability to perform Therex. 06/04/17  4. Patient will increase Postural musculature strength to 5/5 to allow for return to PLOF  5. Patient will increase FOTO scores from 49/100 to??63/100 to demonstrate increased functional independence.  ????    PLAN  [x]   Upgrade activities as tolerated     []   Continue plan of care  []   Update interventions per flow sheet       []   Discharge due to:_  []   Other:_      Dayle PointsHaley L Eitan Doubleday, PT 06/07/2017  10:25 AM

## 2017-06-13 ENCOUNTER — Inpatient Hospital Stay: Payer: MEDICARE | Primary: Family Medicine

## 2017-06-19 ENCOUNTER — Inpatient Hospital Stay: Admit: 2017-06-19 | Payer: MEDICARE | Primary: Family Medicine

## 2017-06-19 NOTE — Progress Notes (Signed)
PT DAILY TREATMENT NOTE 8-14    Patient Name: Beth [name]  Date:06/19/2017  DOB: [birth year]  [x]  Patient DOB Verified  Payor: [employer] MEDICARE / Plan: [facility] [employer] MEDICARE ADVANTAGE / Product Type: Managed Care Medicare /    In time:1000  Out time:1045  Total Treatment Time (min): 45  Total Timed Codes (min): 45  1:1 Treatment Time (min): 30   Visit #: 6 of 8    Treatment Area: Cervicalgia [M54.2]    SUBJECTIVE  Pain Level (0-10 scale): 4/10  Any medication changes, allergies to medications, adverse drug reactions, diagnosis change, or new procedure performed?: [x] No    [] Yes (see summary sheet for update)  Subjective functional status/changes:   [] No changes reported  I feel like I am probably about 75% better. I have been doing my stretches at home and they seem to help. I have also noticed that I do not have as many headaches.     OBJECTIVE  30/25 min Therapeutic Exercise:  [x] See flow sheet :5 min warm up NC. 15 min 1:1   Rationale: increase ROM and increase strength to improve the patient???s ability to perform IADLs without increased pain     15 min Manual Therapy:   Supine SOR, myofascial release to cervical paraspinals, Rt Scalenes, Rt SCM and PROM / Stretching into rotation and lateral flexion. Supine OA nods and cervical retraction.    Rationale: decrease pain, increase ROM, increase tissue extensibility and decrease trigger points to improve functional independence with dressing and daily household tasks           5 min w TE min Patient Education: [x] Review HEP    [] Progressed/Changed HEP based on:   [] positioning   [] body mechanics   [] transfers   [] heat/ice application        Other Objective/Functional Measures:   Cervical AROM Flexion 45*, Extension 35*Side bending 45* B, Rotation 60* B  Scapular Strength 4+/5   B shoulder strength 5/5  Decreased muscular guarding and tension though Scalenes, SCM, UT, cervical paraspinals.     Pain Level (0-10 scale) post treatment: 1/10     ASSESSMENT/Changes in Function: Patient has made progress with PT and has met 3/5 goals and progressing to meet all goals. Patient is I and compliant with HEP to increase scapular strength and cervical ROM and is aware of postural needs to decrease forward head and rounded shoulders resting posture. Patient is DC from PT due to progress that has been met at this time with HEP to continue.     []  See Plan of Care  []  See progress note/recertification  [x]  See Discharge Summary         Progress towards goals / Updated goals:  ??????Short Term Goals: To be accomplished in ??3????treatments  1. Patient will demonstrate I and compliance with HEP to increase Cervical Spine ROM and scapular strength.??RESOLVED per patient report. 06/19/2017 HLL   2. Patient will report decreased pain no greater than 1/10 with activity to allow increased tolerance to activity. RESOLVED 1/10 pain level post therex and manual 06/19/2017  ????????????????????????????????????Long Term Goals: To be accomplished in ??4????weeks:  3. Patient will increase Cervical spine AROM to WNL to promote normalized posture. RESOLVED 06/19/2017 HLL   4. Patient will increase Postural musculature strength to 5/5 to allow for return to PLOF Progressing 4+/5 06/19/2017  5. Patient will increase FOTO scores from 49/100 to??63/100 to demonstrate increased functional independence.Partially  resolved 59/100 06/19/2017     PLAN  []  Upgrade activities as tolerated     []  Continue plan of care  []  Update interventions per flow sheet       [x]  Discharge due to:_goals met or progressing to be met w HEP.     Lacretia Nicks, PT 06/19/2017  10:29 AM

## 2017-06-19 NOTE — Progress Notes (Signed)
[place]  [address], [place] [address]   Phone [phone]  Fax [phone]  DISCHARGE SUMMARY FOR PHYSICAL THERAPY          Patient Name: Beth [name] DOB: 07/01/1932   Treatment/Medical Diagnosis: Cervicalgia [M54.2]   Onset Date: 02/25/2017    Referral Source: Marvis Moeller, PA Start of Care [facility]): 05/16/2017   Prior Hospitalization: See Medical History Provider #: [id number]   Prior Level of Function: Functionally Independent    Comorbidities: OA, hearing impairment    Medications: Verified on Patient Summary List   Visits from [facility]: 6 Missed Visits: 1       Summary of Care: Thank you for referring this pleasant patient.  [name] has completed 6 of 8 proposed sessions and has met or progressing to meet all of long term goals. She reports 75% improvement of symptoms with ADLs   Foto Score on evaluation was 49 and on discharge is 59 - indicating overall improvement. Patient is ready to assume I management of home exercise program to assist with keeping pain level down and decrease change of reoccurence of initial symptoms.  Will advise patient that is any additional follow up or recommendation is needed to return to referring physician.    Other Objective/Functional Measures:                  Cervical AROM Flexion 45*, Extension 35*Side bending 45* B, Rotation 60* B (at initial eval, Flexion 45*, Extension 30*, SB Rt 20*, SB left 20* w pain, Rotation Right 50*, Rotation Left 35* w pain)   Scapular Strength 4+/5  (was 4/5 at initial eval)  B shoulder strength 5/5  Decreased muscular guarding and tension though Scalenes, SCM, UT, cervical paraspinals. (muscular spasms and increased tension present at eval )  ??  GOALS/ MEASURE OF PROGRESS:   ??????Short Term Goals: To be accomplished in ??3????treatments  1. Patient will demonstrate I and compliance with HEP to increase Cervical  Spine ROM and scapular strength.??RESOLVED per patient report. 06/19/2017 HLL   2. Patient will report decreased pain no greater than 1/10 with activity to allow increased tolerance to activity. RESOLVED 1/10 pain level post therex and manual 06/19/2017  ????????????????????????????????????Long Term Goals: To be accomplished in ??4????weeks:  3. Patient will increase Cervical spine AROM to WNL to promote normalized posture. RESOLVED 06/19/2017 HLL   4. Patient will increase Postural musculature strength to 5/5 to allow for return to PLOF Progressing 4+/5 06/19/2017  5. Patient will increase FOTO scores from 49/100 to??63/100 to demonstrate increased functional independence.Partially resolved 59/100 06/19/2017   ??    Key Functional Changes/Progress: Patient reports increased ability to lift an object overhead, increased ability to carry items and decreased interruptions in sleeping due to pain.     G-Codes (GP): Carry [id number] Current  CK= 40-59%  [id number] D/C  CK= 40-59%.  The severity rating is based on the Level of Assistance required for Functional Mobility and ADLs.    Assessments/Recommendations: Discontinue therapy. Progressing towards or have reached established goals.  If you have any questions/comments please contact us directly at [phone].   Thank you for allowing [place] to assist in the care of your patient.    Therapist Signature: Lacretia Nicks, PT Date: 06/19/2017   Reporting Period: 6/21-7/25/18 Time: 7:10 PM

## 2017-08-12 ENCOUNTER — Encounter

## 2019-12-12 ENCOUNTER — Other Ambulatory Visit: Payer: Self-pay

## 2019-12-12 ENCOUNTER — Emergency Department (HOSPITAL_COMMUNITY): Payer: Medicare HMO

## 2019-12-12 ENCOUNTER — Encounter (HOSPITAL_COMMUNITY): Payer: Self-pay

## 2019-12-12 ENCOUNTER — Inpatient Hospital Stay (HOSPITAL_COMMUNITY)
Admission: EM | Admit: 2019-12-12 | Discharge: 2019-12-17 | DRG: 177 | Disposition: A | Discharge status: Home Health | Payer: Medicare HMO | Attending: Internal Medicine | Admitting: Internal Medicine

## 2019-12-12 DIAGNOSIS — I10 Essential (primary) hypertension: Secondary | ICD-10-CM | POA: Diagnosis present

## 2019-12-12 DIAGNOSIS — M810 Age-related osteoporosis without current pathological fracture: Secondary | ICD-10-CM | POA: Diagnosis present

## 2019-12-12 DIAGNOSIS — E039 Hypothyroidism, unspecified: Secondary | ICD-10-CM | POA: Diagnosis present

## 2019-12-12 DIAGNOSIS — R2681 Unsteadiness on feet: Secondary | ICD-10-CM | POA: Diagnosis present

## 2019-12-12 DIAGNOSIS — Z79890 Hormone replacement therapy (postmenopausal): Secondary | ICD-10-CM

## 2019-12-12 DIAGNOSIS — J8410 Pulmonary fibrosis, unspecified: Secondary | ICD-10-CM | POA: Diagnosis present

## 2019-12-12 DIAGNOSIS — Z79899 Other long term (current) drug therapy: Secondary | ICD-10-CM

## 2019-12-12 DIAGNOSIS — U071 COVID-19: Secondary | ICD-10-CM

## 2019-12-12 DIAGNOSIS — R262 Difficulty in walking, not elsewhere classified: Secondary | ICD-10-CM | POA: Diagnosis present

## 2019-12-12 DIAGNOSIS — E785 Hyperlipidemia, unspecified: Secondary | ICD-10-CM | POA: Diagnosis present

## 2019-12-12 DIAGNOSIS — G9341 Metabolic encephalopathy: Secondary | ICD-10-CM | POA: Diagnosis present

## 2019-12-12 DIAGNOSIS — I160 Hypertensive urgency: Secondary | ICD-10-CM

## 2019-12-12 DIAGNOSIS — Z833 Family history of diabetes mellitus: Secondary | ICD-10-CM

## 2019-12-12 DIAGNOSIS — J440 Chronic obstructive pulmonary disease with acute lower respiratory infection: Secondary | ICD-10-CM | POA: Diagnosis present

## 2019-12-12 DIAGNOSIS — I161 Hypertensive emergency: Secondary | ICD-10-CM | POA: Diagnosis present

## 2019-12-12 DIAGNOSIS — K219 Gastro-esophageal reflux disease without esophagitis: Secondary | ICD-10-CM | POA: Diagnosis present

## 2019-12-12 DIAGNOSIS — G309 Alzheimer's disease, unspecified: Secondary | ICD-10-CM | POA: Diagnosis present

## 2019-12-12 DIAGNOSIS — J1282 Pneumonia due to coronavirus disease 2019: Secondary | ICD-10-CM

## 2019-12-12 DIAGNOSIS — F0280 Dementia in other diseases classified elsewhere without behavioral disturbance: Secondary | ICD-10-CM

## 2019-12-12 DIAGNOSIS — Z9114 Patient's other noncompliance with medication regimen: Secondary | ICD-10-CM

## 2019-12-12 LAB — CBC WITH DIFFERENTIAL/PLATELET
Abs Immature Granulocytes: 0.04 10*3/uL (ref 0.00–0.07)
Basophils Absolute: 0 10*3/uL (ref 0.0–0.1)
Basophils Relative: 0 %
Eosinophils Absolute: 0.1 10*3/uL (ref 0.0–0.5)
Eosinophils Relative: 1 %
HCT: 38.5 % (ref 36.0–46.0)
Hemoglobin: 12.6 g/dL (ref 12.0–15.0)
Immature Granulocytes: 0 %
Lymphocytes Relative: 19 %
Lymphs Abs: 1.7 10*3/uL (ref 0.7–4.0)
MCH: 29.5 pg (ref 26.0–34.0)
MCHC: 32.7 g/dL (ref 30.0–36.0)
MCV: 90.2 fL (ref 80.0–100.0)
Monocytes Absolute: 1 10*3/uL (ref 0.1–1.0)
Monocytes Relative: 11 %
Neutro Abs: 6.2 10*3/uL (ref 1.7–7.7)
Neutrophils Relative %: 69 %
Platelets: 267 10*3/uL (ref 150–400)
RBC: 4.27 MIL/uL (ref 3.87–5.11)
RDW: 12.8 % (ref 11.5–15.5)
WBC: 9 10*3/uL (ref 4.0–10.5)
nRBC: 0 % (ref 0.0–0.2)

## 2019-12-12 LAB — COMPREHENSIVE METABOLIC PANEL
ALT: 16 U/L (ref 0–44)
AST: 26 U/L (ref 15–41)
Albumin: 4.3 g/dL (ref 3.5–5.0)
Alkaline Phosphatase: 87 U/L (ref 38–126)
Anion gap: 12 (ref 5–15)
BUN: 22 mg/dL (ref 8–23)
CO2: 24 mmol/L (ref 22–32)
Calcium: 9.2 mg/dL (ref 8.9–10.3)
Chloride: 98 mmol/L (ref 98–111)
Creatinine, Ser: 1.03 mg/dL — ABNORMAL HIGH (ref 0.44–1.00)
GFR calc Af Amer: 57 mL/min — ABNORMAL LOW (ref >60)
GFR calc non Af Amer: 50 mL/min — ABNORMAL LOW (ref >60)
Glucose, Bld: 91 mg/dL (ref 70–99)
Potassium: 3.9 mmol/L (ref 3.5–5.1)
Sodium: 134 mmol/L — ABNORMAL LOW (ref 135–145)
Total Bilirubin: 0.8 mg/dL (ref 0.3–1.2)
Total Protein: 7.9 g/dL (ref 6.5–8.1)

## 2019-12-12 LAB — POC SARS CORONAVIRUS 2 AG -  ED: SARS Coronavirus 2 Ag: POSITIVE — AB

## 2019-12-12 LAB — URINALYSIS, ROUTINE W REFLEX MICROSCOPIC
Bilirubin Urine: NEGATIVE
Glucose, UA: NEGATIVE mg/dL
Hgb urine dipstick: NEGATIVE
Ketones, ur: NEGATIVE mg/dL
Leukocytes,Ua: NEGATIVE
Nitrite: NEGATIVE
Protein, ur: NEGATIVE mg/dL
Specific Gravity, Urine: 1.012 (ref 1.005–1.030)
pH: 6 (ref 5.0–8.0)

## 2019-12-12 LAB — TROPONIN I (HIGH SENSITIVITY): Troponin I (High Sensitivity): 5 ng/L (ref <18)

## 2019-12-12 MED ORDER — SODIUM CHLORIDE 0.9 % IV BOLUS
500.0000 mL | Freq: Once | INTRAVENOUS | Status: AC
  Administered 2019-12-12: 500 mL via INTRAVENOUS

## 2019-12-12 MED ORDER — HYDRALAZINE HCL 20 MG/ML IJ SOLN
5.0000 mg | Freq: Once | INTRAMUSCULAR | Status: AC
  Administered 2019-12-13: 5 mg via INTRAVENOUS
  Filled 2019-12-12: qty 1

## 2019-12-12 MED ORDER — SODIUM CHLORIDE 0.9 % IV BOLUS
250.0000 mL | Freq: Once | INTRAVENOUS | Status: AC
  Administered 2019-12-12: 250 mL via INTRAVENOUS

## 2019-12-12 NOTE — ED Notes (Signed)
Patient transported to CT 

## 2019-12-12 NOTE — ED Triage Notes (Signed)
She tells me that she has had a decreased appetite for "about two days". She denies any pain/fever/n/v/d and is in no distress. She does mention a recent move to [place] to live with her son. She also tells me she is not on any medication(s) and she has no known medical and surgical history.

## 2019-12-12 NOTE — ED Provider Notes (Signed)
[facility]-EMERGENCY DEPT Provider Note   CSN: [id number] Arrival date & time: 12/12/19  1623     History Chief Complaint  Patient presents with  . Weakness   Level 5 caveat due to altered mental status  Amanda Sanford is a 86 y.o. female with history of Alzheimer's dementia, pulmonary fibrosis, hyperlipidemia, osteoporosis, COPD, GERD, hypothyroidism presents for evaluation of acute onset, progressively worsening decreased appetite, general malaise for 2 days.  She tells me that she has a frontal headache, no head injury.  She states she has no history of headaches.  Denies vision changes, numbness or weakness of the extremities, fever or neck stiffness.  She does have a persistent cough on my examination, is unsure how long this has been present.  She tells me she recently relocated from [place] 3 months ago to live with her son here.  She is oriented to person and time but not place.  I spoke with the patient's son Joaquina Nissen on the phone with patient's permission.  He notes that she was diagnosed with Alzheimer's dementia around 3 years ago but her symptoms have significantly worsened over the last year.  He notes that over the last 2 years she has been losing weight and has had decreased appetite and decreased oral intake.  She will typically drink a couple of ensures daily but otherwise little oral intake despite encouragement from family. He notes that over the last several days her confusion has worsened and she has not had anything to eat or drink.  She is typically ambulatory without the aid of any assistive devices but they have noted she has been very dizzy with standing and unstable with ambulation.  They noted development of cough around 1 week ago.  She actually moved from [place] 9 or 10 months ago with her husband to live with her son full-time.  Her son notes that patient has been tested positive for Covid 2 weeks ago.  Lady Deutscher FNP is her PCP    The history is provided by the patient and a relative. The history is limited by the condition of the patient.       No past medical history on file.  There are no problems to display for this patient.   OB History   No obstetric history on file.     No family history on file.  Social History   Tobacco Use  . Smoking status: Never Smoker  . Smokeless tobacco: Never Used  Substance Use Topics  . Alcohol use: Never  . Drug use: Never    Home Medications Prior to Admission medications   Not on File    Allergies    Patient has no known allergies.  Review of Systems   Review of Systems  Unable to perform ROS: Mental status change    Physical Exam Updated Vital Signs BP (!) 193/79   Pulse (!) 55   Temp 98.3 F (36.8 C) (Oral)   Resp 16   SpO2 100%   Physical Exam Vitals and nursing note reviewed.  Constitutional:      General: She is not in acute distress.    Appearance: She is well-developed.  HENT:     Head: Normocephalic and atraumatic.  Eyes:     General:        Right eye: No discharge.        Left eye: No discharge.     Extraocular Movements: Extraocular movements intact.     Conjunctiva/sclera: Conjunctivae  normal.     Pupils: Pupils are equal, round, and reactive to light.  Neck:     Vascular: No JVD.     Trachea: No tracheal deviation.  Cardiovascular:     Rate and Rhythm: Normal rate.  Pulmonary:     Effort: Pulmonary effort is normal.     Comments: Frequent dry cough.  Bibasilar crackles noted.  Speaking in full sentences without difficulty Abdominal:     General: Abdomen is flat. Bowel sounds are normal. There is no distension.     Palpations: Abdomen is soft.     Tenderness: There is abdominal tenderness in the right lower quadrant, suprapubic area and left lower quadrant. There is no right CVA tenderness or left CVA tenderness.     Comments: Mild lower abdominal discomfort on palpation  Musculoskeletal:     Cervical back: Neck  supple.  Skin:    General: Skin is warm and dry.     Findings: No erythema.  Neurological:     Mental Status: She is alert.     Sensory: No sensory deficit.     Gait: Gait abnormal.     Comments: Mental Status:  Alert, mildly confused.  Oriented to person and time but not place.  Speech fluent without evidence of aphasia. Able to follow 2 step commands without difficulty.  Cranial Nerves:  II:  Peripheral visual fields grossly normal, pupils equal, round, reactive to light III,IV, VI: ptosis not present, extra-ocular motions intact bilaterally  V,VII: smile symmetric, facial light touch sensation equal VIII: hearing grossly normal to voice  X: uvula elevates symmetrically  XI: bilateral shoulder shrug symmetric and strong XII: midline tongue extension without fassiculations Motor:  Normal tone. 5/5 strength of BUE and BLE major muscle groups including strong and equal grip strength and dorsiflexion/plantar flexion, no pronator drift Sensory: light touch normal in all extremities. Gait: Requires assistance with ambulation due to feeling lightheaded with standing, exhibits some ataxia.  Unable to heel walk or toe walk.  Steadies herself by grabbing onto nearby objects.  Psychiatric:        Behavior: Behavior normal.     ED Results / Procedures / Treatments   Labs (all labs ordered are listed, but only abnormal results are displayed) Labs Reviewed  COMPREHENSIVE METABOLIC PANEL - Abnormal; Notable for the following components:      Result Value   Sodium 134 (*)    Creatinine, Ser 1.03 (*)    GFR calc non Af Amer 50 (*)    GFR calc Af Amer 57 (*)    All other components within normal limits  URINALYSIS, ROUTINE W REFLEX MICROSCOPIC - Abnormal; Notable for the following components:   Bacteria, UA RARE (*)    All other components within normal limits  POC SARS CORONAVIRUS 2 AG -  ED - Abnormal; Notable for the following components:   SARS Coronavirus 2 Ag POSITIVE (*)    All  other components within normal limits  URINE CULTURE  CBC WITH DIFFERENTIAL/PLATELET  TROPONIN I (HIGH SENSITIVITY)    EKG EKG Interpretation  Date/Time:  Saturday December 12 2019 17:17:13 EST Ventricular Rate:  60 PR Interval:    QRS Duration: 84 QT Interval:  488 QTC Calculation: 488 R Axis:   47 Text Interpretation: Sinus rhythm Borderline short PR interval Borderline prolonged QT interval No previous ECGs available Confirmed by Richardean Canal [phone]) on 12/12/2019 8:23:54 PM   Radiology CT Head Wo Contrast  Result Date: 12/12/2019 CLINICAL DATA:  [age]  year female with altered mental status. EXAM: CT HEAD WITHOUT CONTRAST TECHNIQUE: Contiguous axial images were obtained from the base of the skull through the vertex without intravenous contrast. COMPARISON:  None. FINDINGS: Brain: There is mild age-related atrophy and moderate chronic microvascular ischemic changes. There is no acute intracranial hemorrhage. No mass effect or midline shift. No extra-axial fluid collection. Vascular: No hyperdense vessel or unexpected calcification. Skull: Normal. Negative for fracture or focal lesion. Sinuses/Orbits: No acute finding. Other: None IMPRESSION: 1. No acute intracranial hemorrhage. 2. Age-related atrophy and chronic microvascular ischemic changes. Electronically Signed   By: Anner Crete M.D.   On: 12/12/2019 17:40   MR BRAIN WO CONTRAST  Result Date: 12/12/2019 CLINICAL DATA:  Initial evaluation for acute ataxia, stroke suspected. EXAM: MRI HEAD WITHOUT CONTRAST TECHNIQUE: Multiplanar, multiecho pulse sequences of the brain and surrounding structures were obtained without intravenous contrast. COMPARISON:  Prior CT from earlier same day. FINDINGS: Brain: Examination technically limited as the patient was unable to tolerate the full length of the exam. Additionally, images provided are markedly degraded by motion. Generalized age-related cerebral atrophy. Patchy and confluent T2/FLAIR  hyperintensity within the periventricular deep white matter most consistent with chronic small vessel ischemic disease, moderate nature. Multiple scattered superimposed remote lacunar infarcts present within the bilateral basal ganglia. Patchy small vessel changes noted within the pons as well. No abnormal foci of restricted diffusion to suggest acute or subacute ischemia. Gray-white matter differentiation maintained. No areas of remote cortical infarction. No definite evidence for acute intracranial hemorrhage. No mass lesion or midline shift. No hydrocephalus. No extra-axial fluid collection. Pituitary gland grossly normal. Midline structures intact. Vascular: Major intracranial vascular flow voids are grossly maintained at the skull base. Skull and upper cervical spine: Craniocervical junction within normal limits. Bone marrow signal intensity grossly normal. No scalp soft tissue abnormality. Sinuses/Orbits: Patient status post bilateral ocular lens replacement. Paranasal sinuses are grossly clear. No mastoid effusion. Other: None. IMPRESSION: 1. Technically limited exam due to motion artifact and the patient's inability to tolerate the full length of the study. 2. No acute intracranial infarct or other definite abnormality. 3. Age-related cerebral atrophy with moderate chronic microvascular ischemic disease, with multiple superimposed remote lacunar infarcts within the bilateral basal ganglia. Electronically Signed   By: Jeannine Boga M.D.   On: 12/12/2019 22:56   DG Chest Portable 1 View  Result Date: 12/12/2019 CLINICAL DATA:  Weakness and cough. EXAM: PORTABLE CHEST 1 VIEW COMPARISON:  None. FINDINGS: Cardiomediastinal silhouette is normal. Mediastinal contours appear intact. Calcific atherosclerotic disease of the aorta. Nodular opacities in the right upper and lower lung fields may represent pulmonary nodules, pleural scarring or focal airspace consolidation. Osseous structures are without acute  abnormality. Soft tissues are grossly normal. IMPRESSION: 1. Nodular opacities in the right upper and lower lung fields may represent pulmonary nodules, pleural scarring or focal airspace consolidation. 2. Calcific atherosclerotic disease of the aorta. Electronically Signed   By: Fidela Salisbury M.D.   On: 12/12/2019 17:43    Procedures Procedures (including critical care time)  Medications Ordered in ED Medications  sodium chloride 0.9 % bolus 250 mL (0 mLs Intravenous Stopped 12/12/19 1826)  sodium chloride 0.9 % bolus 500 mL (500 mLs Intravenous New Bag/Given 12/12/19 2316)  hydrALAZINE (APRESOLINE) injection 5 mg (5 mg Intravenous Given 12/13/19 0007)    ED Course  I have reviewed the triage vital signs and the nursing notes.  Pertinent labs & imaging results that were available during my care of the  patient were reviewed by me and considered in my medical decision making (see chart for details).    MDM Rules/Calculators/A&P                      Jasmine Mcbeth was evaluated in Emergency Department on 12/13/2019 for the symptoms described in the history of present illness. She was evaluated in the context of the global COVID-19 pandemic, which necessitated consideration that the patient might be at risk for infection with the SARS-CoV-2 virus that causes COVID-19. Institutional protocols and algorithms that pertain to the evaluation of patients at risk for COVID-19 are in a state of rapid change based on information released by regulatory bodies including the CDC and federal and state organizations. These policies and algorithms were followed during the patient's care in the ED.  Patient presenting for evaluation of weakness, difficulty ambulating, decreased oral intake, cough.  She is afebrile, persistently hypertensive in the ED.  She has no history of hypertension.  Vital signs otherwise stable.  She is nontoxic in appearance.  EKG shows normal sinus rhythm and a troponin was obtained  which was negative.  She has no complaint of chest pain and I have a low suspicion of ACS/MI.  Lab work reviewed by me shows no leukocytosis, no anemia, no metabolic derangements.  Creatinine is mildly elevated.  UA is not concerning for UTI or nephrolithiasis.  Point-of-care Covid test is negative.  Her husband tested positive for Covid 2 weeks ago.  Her chest x-ray shows nodular opacities which could be consistent with scarring, pulmonary nodules, or focal airspace disease.  She has a history of pulmonary fibrosis.  She was ambulated in the ED with stable SPO2 saturations.  She has difficulty with ambulating, unsteady gait.  Notes feeling unsteady with ambulation.  She is complaining of a headache and with her hypertension and ataxia head CT was obtained initially which showed no acute intracranial abnormalities, subsequently followed by MRI which showed no evidence of acute infarct.  She does have moderate chronic microvascular ischemic disease and multiple superimposed remote lacunar infarcts within the bilateral basal ganglia.  Given ischemic stroke has been ruled out with MRI, I am concerned that she is experiencing neurologic symptoms in the setting of hypertensive emergency.  We will give IV hydralazine for management of her hypertension.  I spoke with Dr. Park Breed with [facility] hospitalist service who will evaluate the patient emergently in the ED.   Final Clinical Impression(s) / ED Diagnoses Final diagnoses:  Hypertensive emergency  COVID-19    Rx / DC Orders ED Discharge Orders    None       Jeanie Sewer, PA-C 12/13/19 0032    Charlynne Pander, MD 12/13/19 [phone]

## 2019-12-12 NOTE — ED Notes (Addendum)
Pt 02Sat 100 at rest. O2 levels went to 95 while ambulating.

## 2019-12-12 NOTE — ED Notes (Signed)
Pt transported to MRI 

## 2019-12-13 ENCOUNTER — Inpatient Hospital Stay (HOSPITAL_COMMUNITY): Payer: Medicare HMO

## 2019-12-13 DIAGNOSIS — J449 Chronic obstructive pulmonary disease, unspecified: Secondary | ICD-10-CM

## 2019-12-13 DIAGNOSIS — Z9114 Patient's other noncompliance with medication regimen: Secondary | ICD-10-CM | POA: Diagnosis not present

## 2019-12-13 DIAGNOSIS — K219 Gastro-esophageal reflux disease without esophagitis: Secondary | ICD-10-CM | POA: Diagnosis present

## 2019-12-13 DIAGNOSIS — E785 Hyperlipidemia, unspecified: Secondary | ICD-10-CM | POA: Diagnosis present

## 2019-12-13 DIAGNOSIS — F0280 Dementia in other diseases classified elsewhere without behavioral disturbance: Secondary | ICD-10-CM

## 2019-12-13 DIAGNOSIS — G9341 Metabolic encephalopathy: Secondary | ICD-10-CM | POA: Diagnosis present

## 2019-12-13 DIAGNOSIS — G309 Alzheimer's disease, unspecified: Secondary | ICD-10-CM | POA: Diagnosis present

## 2019-12-13 DIAGNOSIS — R2681 Unsteadiness on feet: Secondary | ICD-10-CM | POA: Diagnosis present

## 2019-12-13 DIAGNOSIS — I160 Hypertensive urgency: Secondary | ICD-10-CM

## 2019-12-13 DIAGNOSIS — J8410 Pulmonary fibrosis, unspecified: Secondary | ICD-10-CM | POA: Diagnosis present

## 2019-12-13 DIAGNOSIS — R42 Dizziness and giddiness: Secondary | ICD-10-CM

## 2019-12-13 DIAGNOSIS — E039 Hypothyroidism, unspecified: Secondary | ICD-10-CM | POA: Diagnosis present

## 2019-12-13 DIAGNOSIS — J1282 Pneumonia due to coronavirus disease 2019: Secondary | ICD-10-CM | POA: Diagnosis present

## 2019-12-13 DIAGNOSIS — J440 Chronic obstructive pulmonary disease with acute lower respiratory infection: Secondary | ICD-10-CM | POA: Diagnosis present

## 2019-12-13 DIAGNOSIS — I10 Essential (primary) hypertension: Secondary | ICD-10-CM | POA: Diagnosis present

## 2019-12-13 DIAGNOSIS — I340 Nonrheumatic mitral (valve) insufficiency: Secondary | ICD-10-CM | POA: Diagnosis not present

## 2019-12-13 DIAGNOSIS — U071 COVID-19: Secondary | ICD-10-CM | POA: Diagnosis present

## 2019-12-13 DIAGNOSIS — I341 Nonrheumatic mitral (valve) prolapse: Secondary | ICD-10-CM

## 2019-12-13 DIAGNOSIS — I161 Hypertensive emergency: Secondary | ICD-10-CM | POA: Diagnosis present

## 2019-12-13 DIAGNOSIS — R262 Difficulty in walking, not elsewhere classified: Secondary | ICD-10-CM | POA: Diagnosis present

## 2019-12-13 DIAGNOSIS — Z833 Family history of diabetes mellitus: Secondary | ICD-10-CM | POA: Diagnosis not present

## 2019-12-13 DIAGNOSIS — Z79890 Hormone replacement therapy (postmenopausal): Secondary | ICD-10-CM | POA: Diagnosis not present

## 2019-12-13 DIAGNOSIS — Z79899 Other long term (current) drug therapy: Secondary | ICD-10-CM | POA: Diagnosis not present

## 2019-12-13 DIAGNOSIS — M810 Age-related osteoporosis without current pathological fracture: Secondary | ICD-10-CM | POA: Diagnosis present

## 2019-12-13 LAB — CBC
HCT: 38.4 % (ref 36.0–46.0)
Hemoglobin: 12.6 g/dL (ref 12.0–15.0)
MCH: 29.4 pg (ref 26.0–34.0)
MCHC: 32.8 g/dL (ref 30.0–36.0)
MCV: 89.7 fL (ref 80.0–100.0)
Platelets: 278 10*3/uL (ref 150–400)
RBC: 4.28 MIL/uL (ref 3.87–5.11)
RDW: 12.9 % (ref 11.5–15.5)
WBC: 7.4 10*3/uL (ref 4.0–10.5)
nRBC: 0 % (ref 0.0–0.2)

## 2019-12-13 LAB — CREATININE, SERUM
Creatinine, Ser: 0.92 mg/dL (ref 0.44–1.00)
GFR calc Af Amer: >60 mL/min (ref >60)
GFR calc non Af Amer: 57 mL/min — ABNORMAL LOW (ref >60)

## 2019-12-13 LAB — ABO/RH: ABO/RH(D): B POS

## 2019-12-13 MED ORDER — LISINOPRIL 5 MG PO TABS
5.0000 mg | ORAL_TABLET | Freq: Every day | ORAL | Status: DC
  Administered 2019-12-13 – 2019-12-17 (×5): 5 mg via ORAL
  Filled 2019-12-13 (×5): qty 1

## 2019-12-13 MED ORDER — SODIUM CHLORIDE 0.9 % IV SOLN
200.0000 mg | Freq: Once | INTRAVENOUS | Status: AC
  Administered 2019-12-13: 200 mg via INTRAVENOUS
  Filled 2019-12-13: qty 200

## 2019-12-13 MED ORDER — DEXAMETHASONE SODIUM PHOSPHATE 10 MG/ML IJ SOLN
6.0000 mg | INTRAMUSCULAR | Status: DC
  Administered 2019-12-13: 6 mg via INTRAVENOUS
  Filled 2019-12-13: qty 1

## 2019-12-13 MED ORDER — LABETALOL HCL 5 MG/ML IV SOLN
10.0000 mg | Freq: Four times a day (QID) | INTRAVENOUS | Status: DC | PRN
  Filled 2019-12-13: qty 4

## 2019-12-13 MED ORDER — ASCORBIC ACID 500 MG PO TABS
500.0000 mg | ORAL_TABLET | Freq: Every day | ORAL | Status: DC
  Administered 2019-12-13 – 2019-12-17 (×5): 500 mg via ORAL
  Filled 2019-12-13 (×5): qty 1

## 2019-12-13 MED ORDER — FOLIC ACID 1 MG PO TABS
1.0000 mg | ORAL_TABLET | Freq: Every day | ORAL | Status: DC
  Administered 2019-12-13 – 2019-12-17 (×5): 1 mg via ORAL
  Filled 2019-12-13 (×5): qty 1

## 2019-12-13 MED ORDER — ENOXAPARIN SODIUM 30 MG/0.3ML ~~LOC~~ SOLN
30.0000 mg | SUBCUTANEOUS | Status: DC
  Administered 2019-12-14 – 2019-12-17 (×4): 30 mg via SUBCUTANEOUS
  Filled 2019-12-13 (×4): qty 0.3

## 2019-12-13 MED ORDER — HYDROCHLOROTHIAZIDE 12.5 MG PO CAPS
12.5000 mg | ORAL_CAPSULE | Freq: Every day | ORAL | Status: DC
  Administered 2019-12-13: 12.5 mg via ORAL
  Filled 2019-12-13: qty 1

## 2019-12-13 MED ORDER — ACETAMINOPHEN 325 MG PO TABS
650.0000 mg | ORAL_TABLET | Freq: Four times a day (QID) | ORAL | Status: DC | PRN
  Administered 2019-12-16: 650 mg via ORAL
  Filled 2019-12-13: qty 2

## 2019-12-13 MED ORDER — SODIUM CHLORIDE 0.9% IV SOLUTION: Freq: Once | INTRAVENOUS | Status: DC

## 2019-12-13 MED ORDER — SODIUM CHLORIDE 0.9 % IV SOLN
100.0000 mg | Freq: Every day | INTRAVENOUS | Status: AC
  Administered 2019-12-14 – 2019-12-17 (×4): 100 mg via INTRAVENOUS
  Filled 2019-12-13 (×3): qty 20
  Filled 2019-12-13: qty 100

## 2019-12-13 MED ORDER — ONDANSETRON HCL 4 MG/2ML IJ SOLN: 4.0000 mg | Freq: Four times a day (QID) | INTRAMUSCULAR | Status: DC | PRN

## 2019-12-13 MED ORDER — ADULT MULTIVITAMIN W/MINERALS CH
1.0000 | ORAL_TABLET | Freq: Every day | ORAL | Status: DC
  Administered 2019-12-13 – 2019-12-17 (×5): 1 via ORAL
  Filled 2019-12-13 (×5): qty 1

## 2019-12-13 MED ORDER — ENSURE ENLIVE PO LIQD
237.0000 mL | Freq: Two times a day (BID) | ORAL | Status: DC
  Administered 2019-12-14 – 2019-12-17 (×7): 237 mL via ORAL

## 2019-12-13 MED ORDER — ONDANSETRON HCL 4 MG PO TABS: 4.0000 mg | ORAL_TABLET | Freq: Four times a day (QID) | ORAL | Status: DC | PRN

## 2019-12-13 MED ORDER — ENOXAPARIN SODIUM 40 MG/0.4ML ~~LOC~~ SOLN
40.0000 mg | SUBCUTANEOUS | Status: DC
  Administered 2019-12-13: 40 mg via SUBCUTANEOUS
  Filled 2019-12-13: qty 0.4

## 2019-12-13 MED ORDER — AMLODIPINE BESYLATE 5 MG PO TABS
5.0000 mg | ORAL_TABLET | Freq: Every day | ORAL | Status: DC
  Administered 2019-12-13 – 2019-12-17 (×5): 5 mg via ORAL
  Filled 2019-12-13 (×5): qty 1

## 2019-12-13 MED ORDER — ZINC SULFATE 220 (50 ZN) MG PO CAPS
220.0000 mg | ORAL_CAPSULE | Freq: Every day | ORAL | Status: DC
  Administered 2019-12-13 – 2019-12-17 (×5): 220 mg via ORAL
  Filled 2019-12-13 (×5): qty 1

## 2019-12-13 NOTE — Progress Notes (Signed)
SLP Cancellation Note  Patient Details Name: Amanda Sanford MRN: [id number] DOB: 1932-11-11   Cancelled treatment:        Clinical swallow evaluation not completed. Pt off floor for procedure. Will see as schedule permits.    Luis Abed. MA CCC-SLP 12/13/2019, 3:31 PM

## 2019-12-13 NOTE — Progress Notes (Signed)
[facility] PROGRESS NOTE    Progress Note  Amanda Sanford  SPQ:[id number] DOB: 1932-03-16 DOA: 12/12/2019 PCP: System, Pcp Not In     Brief Narrative:   Amanda Sanford is an 86 y.o. female past medical history significant for COPD, Alzheimer's dementia pulmonary fibrosis not oxygen dependent, comes into the ED for frontal headache, generalized weakness and gait instability.  The patient is a poor historian due to her advanced dementia and most of the history was obtained from the ED physician's note.  The patient has been having unstable gait with ambulation, dizziness poor appetite and intermittent episodes of confusion as per son.  New dry cough but denies fever chills nausea vomiting or diarrhea.  In the ED she was satting 100% on room air her SARS-CoV-2 PCR was positive chest x-ray showed bilateral multifocal infiltrates.  MRI of the brain was done that showed no acute infarcts but it did show chronic microvascular ischemic changes superimposed on remote lacunar bilateral basal ganglia infarcts.  Assessment/Plan:   Acute metabolic encephalopathy: CT scan of the head was negative for bleed and acute intracranial pathologies. MRI of the brain showed no acute changes but it did show chronic microvascular changes and old lacunar infarcts bilaterally of basal ganglia. She has had significant poor oral intake due to her new SARS-CoV-2 infection in the setting of advanced Alzheimer's dementia. Physical therapy has been consulted.  Hypertensive urgency: As the patient was noncompliant with her medication she was started on IV antihypertensive, her blood pressure has improved about 20 to 30%, with IV as needed medications.   Going through her med rec she is on no antihypertensive medications at home, I will ask pharmacy to perform the med rec. At this point in time we will start her on hydrochlorothiazide, Norvasc and lisinopril. Continue IV labetalol and hydralazine IV as  needed.  Ambulatory dysfunction: PT OT has been consulted.  Pneumonia due to COVID-19 Saturations have been greater than 96% on room air, chest x-ray did show bilateral multifocal pneumonia the SARS-CoV-2 PCR positive on admission. We will continue to trend CRP and D-dimer, inflammatory markers are pending this morning.  I agree with IV remdesivir, I will start her on IV dexamethasone, will give her convalescent plasma. Check keep the patient prone for at least 16 hours a day, if not prone out of bed to chair, continues into spirometry and flutter valve.  Start empirically on vitamin C and zinc.   DVT prophylaxis: lovenox Family Communication:Husband Disposition Plan/Barrier to D/C: Once she has completed treatment for IV remdesivir. Where the patient is from  Anticipated d/c place.  Barriers to d/c OR conditions which need to be met to effect a safe d/c.  Code Status:     Code Status Orders  (From admission, onward)         Start     Ordered   12/13/19 0207  Full code  Continuous     12/13/19 0220        Code Status History    This patient has a current code status but no historical code status.   Advance Care Planning Activity        IV Access:    Peripheral IV   Procedures and diagnostic studies:   CT Head Wo Contrast  Result Date: 12/12/2019 CLINICAL DATA:  86 year female with altered mental status. EXAM: CT HEAD WITHOUT CONTRAST TECHNIQUE: Contiguous axial images were obtained from the base of the skull through the vertex without intravenous contrast. COMPARISON:  None. FINDINGS: Brain: There is mild age-related atrophy and moderate chronic microvascular ischemic changes. There is no acute intracranial hemorrhage. No mass effect or midline shift. No extra-axial fluid collection. Vascular: No hyperdense vessel or unexpected calcification. Skull: Normal. Negative for fracture or focal lesion. Sinuses/Orbits: No acute finding. Other: None IMPRESSION: 1. No  acute intracranial hemorrhage. 2. Age-related atrophy and chronic microvascular ischemic changes. Electronically Signed   By: Anner Crete M.D.   On: 12/12/2019 17:40   MR BRAIN WO CONTRAST  Result Date: 12/12/2019 CLINICAL DATA:  Initial evaluation for acute ataxia, stroke suspected. EXAM: MRI HEAD WITHOUT CONTRAST TECHNIQUE: Multiplanar, multiecho pulse sequences of the brain and surrounding structures were obtained without intravenous contrast. COMPARISON:  Prior CT from earlier same day. FINDINGS: Brain: Examination technically limited as the patient was unable to tolerate the full length of the exam. Additionally, images provided are markedly degraded by motion. Generalized age-related cerebral atrophy. Patchy and confluent T2/FLAIR hyperintensity within the periventricular deep white matter most consistent with chronic small vessel ischemic disease, moderate nature. Multiple scattered superimposed remote lacunar infarcts present within the bilateral basal ganglia. Patchy small vessel changes noted within the pons as well. No abnormal foci of restricted diffusion to suggest acute or subacute ischemia. Gray-white matter differentiation maintained. No areas of remote cortical infarction. No definite evidence for acute intracranial hemorrhage. No mass lesion or midline shift. No hydrocephalus. No extra-axial fluid collection. Pituitary gland grossly normal. Midline structures intact. Vascular: Major intracranial vascular flow voids are grossly maintained at the skull base. Skull and upper cervical spine: Craniocervical junction within normal limits. Bone marrow signal intensity grossly normal. No scalp soft tissue abnormality. Sinuses/Orbits: Patient status post bilateral ocular lens replacement. Paranasal sinuses are grossly clear. No mastoid effusion. Other: None. IMPRESSION: 1. Technically limited exam due to motion artifact and the patient's inability to tolerate the full length of the study. 2. No  acute intracranial infarct or other definite abnormality. 3. Age-related cerebral atrophy with moderate chronic microvascular ischemic disease, with multiple superimposed remote lacunar infarcts within the bilateral basal ganglia. Electronically Signed   By: Jeannine Boga M.D.   On: 12/12/2019 22:56   DG Chest Portable 1 View  Result Date: 12/12/2019 CLINICAL DATA:  Weakness and cough. EXAM: PORTABLE CHEST 1 VIEW COMPARISON:  None. FINDINGS: Cardiomediastinal silhouette is normal. Mediastinal contours appear intact. Calcific atherosclerotic disease of the aorta. Nodular opacities in the right upper and lower lung fields may represent pulmonary nodules, pleural scarring or focal airspace consolidation. Osseous structures are without acute abnormality. Soft tissues are grossly normal. IMPRESSION: 1. Nodular opacities in the right upper and lower lung fields may represent pulmonary nodules, pleural scarring or focal airspace consolidation. 2. Calcific atherosclerotic disease of the aorta. Electronically Signed   By: Fidela Salisbury M.D.   On: 12/12/2019 17:43     Medical Consultants:    None.  Anti-Infectives:   IV remdesivir  Subjective:    Amanda Sanford nonverbal  Objective:    Vitals:   12/13/19 0445 12/13/19 0500 12/13/19 0537 12/13/19 0545  BP: (!) 164/81 (!) 161/68 (!) 181/77   Pulse: 74 65 (!) 59   Resp: _0 Temp:   98.4 F (36.9 C)   TempSrc:   Oral   SpO2: 100% 99% 100%   Weight:    44.3 kg  Height:    _1  (1.6 m)   SpO2: 100 %   Intake/Output Summary (Last 24 hours) at 12/13/2019 9485 Last data filed at  12/13/2019 0545 Gross per 24 hour  Intake 0 ml  Output 80 ml  Net -80 ml   Filed Weights   12/13/19 0545  Weight: 44.3 kg    Exam: General exam: In no acute distress. Respiratory system: Good air movement and clear to auscultation. Cardiovascular system: S1 & S2 heard, RRR. No Gastrointestinal system: Abdomen is nondistended, soft and  nontender.  Extremities: No pedal edema. Skin: No rashes, lesions or ulcers    Data Reviewed:    Labs: Basic Metabolic Panel: Recent Labs  Lab 12/12/19 1751 12/13/19 0330  NA 134*  --   K 3.9  --   CL 98  --   CO2 24  --   GLUCOSE 91  --   BUN 22  --   CREATININE 1.03* 0.92  CALCIUM 9.2  --    GFR Estimated Creatinine Clearance: 31.3 mL/min (by C-G formula based on SCr of 0.92 mg/dL). Liver Function Tests: Recent Labs  Lab 12/12/19 1751  AST 26  ALT 16  ALKPHOS 87  BILITOT 0.8  PROT 7.9  ALBUMIN 4.3   No results for input(s): LIPASE, AMYLASE in the last 168 hours. No results for input(s): AMMONIA in the last 168 hours. Coagulation profile No results for input(s): INR, PROTIME in the last 168 hours. COVID-19 Labs  No results for input(s): DDIMER, FERRITIN, LDH, CRP in the last 72 hours.  No results found for: SARSCOV2NAA  CBC: Recent Labs  Lab 12/12/19 1751 12/13/19 0330  WBC 9.0 7.4  NEUTROABS 6.2  --   HGB 12.6 12.6  HCT 38.5 38.4  MCV 90.2 89.7  PLT 267 278   Cardiac Enzymes: No results for input(s): CKTOTAL, CKMB, CKMBINDEX, TROPONINI in the last 168 hours. BNP (last 3 results) No results for input(s): PROBNP in the last 8760 hours. CBG: No results for input(s): GLUCAP in the last 168 hours. D-Dimer: No results for input(s): DDIMER in the last 72 hours. Hgb A1c: No results for input(s): HGBA1C in the last 72 hours. Lipid Profile: No results for input(s): CHOL, HDL, LDLCALC, TRIG, CHOLHDL, LDLDIRECT in the last 72 hours. Thyroid function studies: No results for input(s): TSH, T4TOTAL, T3FREE, THYROIDAB in the last 72 hours.  Invalid input(s): FREET3 Anemia work up: No results for input(s): VITAMINB12, FOLATE, FERRITIN, TIBC, IRON, RETICCTPCT in the last 72 hours. Sepsis Labs: Recent Labs  Lab 12/12/19 1751 12/13/19 0330  WBC 9.0 7.4   Microbiology No results found for this or any previous visit (from the past 240  hour(s)).   Medications:   . vitamin C  500 mg Oral Daily  . enoxaparin (LOVENOX) injection  40 mg Subcutaneous Q24H  . feeding supplement (ENSURE ENLIVE)  237 mL Oral BID BM  . folic acid  1 mg Oral Daily  . multivitamin with minerals  1 tablet Oral Daily  . zinc sulfate  220 mg Oral Daily   Continuous Infusions: . [START ON 12/14/2019] remdesivir 100 mg in NS 100 mL       LOS: 0 days   Charlynne Cousins  [facility]  12/13/2019, 8:04 AM

## 2019-12-13 NOTE — H&P (Signed)
History and Physical    Amanda Sanford ZHG:[id number] DOB: 1932/04/08 DOA: 12/12/2019  PCP: System, Pcp Not In (Confirm with patient/family/NH records and if not entered, this has to be entered at [facility] point of entry) Patient coming from: Home  I have personally briefly reviewed patient's old medical records in [facility]  Chief Complaint: Frontal headache and unstable with ambulation.  HPI: Amanda Sanford is a 86 y.o. female with medical history significant of COPD, hypothyroidism, Alzheimer dementia, pulmonary fibrosis and GERD presented to ED for frontal headaches, generalized weakness and recent gait and stability.  Patient is a poor historian therefore history is also collected from the ED physician.  Patient states that she is having continuous throbbing frontal headache without any head injury and she has no history of headaches in the past.  Patient admits of severe sudden weakness in bilateral lower extremities that is slowly improving at this time.  Patient denies any numbness and tingling sensation of extremities but admits of feeling very dizzy and unstable lately with ambulation.  As per ED physician, patient's son reported that she has very poor appetite recently and multiple episodes of confusion on and off and also she started having ambulatory dysfunction that started recently because she was typically ambulatory without the aid of any assistive devices.  Patient admits of having frequent dry cough recently but denies any fever, chills, neck stiffness, chest pain, shortness of breath, nausea, vomiting, abdominal pain and urinary symptoms.  Patient further mentioned that  ED Course: On arrival to the ED patient had blood pressure of 193/79, heart rate 55, temperature 98.3, respiratory rate 16 and oxygen saturation 100% on room air.  Patient was tested positive for COVID-19 infection.  Chest x-ray showed bilateral nodular opacities.  Because of recent ataxia and difficulty with  ambulation.  CT head was done that was negative for acute intracranial bleed or pathology.  MRI brain was done that showed no evidence of acute infarct but it was positive for moderate chronic microvascular ischemic disease and multiple superimposed remote lacunar infarcts in bilateral basal ganglia.  Patient was managed with IV hydralazine for hypertension and admitted into patient for COVID-19 infection and further work-up for neurological symptoms.  Review of Systems: As per HPI otherwise 10 point review of systems negative.   past medical history  Hypertension, COPD, Alzheimer disease    reports that she has never smoked. She has never used smokeless tobacco. She reports that she does not drink alcohol or use drugs.  No Known Allergies  Family history  Patient states that her mother had diabetes while she does not know about her father.  Prior to Admission medications   Medication Sig Start Date End Date Taking? Authorizing Provider  cyanocobalamin (,VITAMIN B-12,) 1000 MCG/ML injection Inject 1 mL into the muscle every 7 (seven) days. 12/02/19  Yes [provider]  levothyroxine (SYNTHROID) 75 MCG tablet Take 75 mcg by mouth daily. 11/30/19  Yes [provider]  memantine (NAMENDA) 5 MG tablet Take 5 mg by mouth 2 (two) times daily.   Yes [provider]  sertraline (ZOLOFT) 50 MG tablet Take 50 mg by mouth daily. 11/30/19  Yes [provider]    Physical Exam: Vitals:   12/13/19 0400 12/13/19 0415 12/13/19 0430 12/13/19 0445  BP: (!) 194/78 (!) 159/69 (!) 175/72 (!) 164/81  Pulse: (!) 59 64 (!) 59 74  Resp: 13 16 16 13   Temp:      TempSrc:  SpO2: 100% 99% 100% 100%    Constitutional: NAD, calm, comfortable Vitals:   12/13/19 0400 12/13/19 0415 12/13/19 0430 12/13/19 0445  BP: (!) 194/78 (!) 159/69 (!) 175/72 (!) 164/81  Pulse: (!) 59 64 (!) 59 74  Resp: 13 16 16 13   Temp:      TempSrc:      SpO2: 100% 99% 100% 100%   General:  86 year old Caucasian female laying in the bed comfortably with no acute distress Eyes: PERRL, lids and conjunctivae normal ENMT: Mucous membranes are moist. Posterior pharynx clear of any exudate or lesions.Normal dentition.  Neck: normal, supple, no masses, no thyromegaly Respiratory: clear to auscultation bilaterally, no wheezing, no crackles. Normal respiratory effort. No accessory muscle use.  Cardiovascular: Regular rate and rhythm, no murmurs / rubs / gallops. No extremity edema. 2+ pedal pulses. No carotid bruits.  Abdomen: no tenderness, no masses palpated. No hepatosplenomegaly. Bowel sounds positive.  Musculoskeletal: no clubbing / cyanosis. No joint deformity upper and lower extremities. Good ROM, no contractures. Normal muscle tone.  Skin: no rashes, lesions, ulcers. No induration Neurologic: CN 2-12 grossly intact. Sensation intact, DTR normal. Strength 3/5 in right lower extremity well 4/5 in left lower extremity. Psychiatric: Normal judgment and insight. Alert and oriented to place and person but not to time.. Normal mood.   Labs on Admission: I have personally reviewed following labs and imaging studies  CBC: Recent Labs  Lab 12/12/19 1751 12/13/19 0330  WBC 9.0 7.4  NEUTROABS 6.2  --   HGB 12.6 12.6  HCT 38.5 38.4  MCV 90.2 89.7  PLT 267 278   Basic Metabolic Panel: Recent Labs  Lab 12/12/19 1751 12/13/19 0330  NA 134*  --   K 3.9  --   CL 98  --   CO2 24  --   GLUCOSE 91  --   BUN 22  --   CREATININE 1.03* 0.92  CALCIUM 9.2  --    GFR: CrCl cannot be calculated (Unknown ideal weight.). Liver Function Tests: Recent Labs  Lab 12/12/19 1751  AST 26  ALT 16  ALKPHOS 87  BILITOT 0.8  PROT 7.9  ALBUMIN 4.3   No results for input(s): LIPASE, AMYLASE in the last 168 hours. No results for input(s): AMMONIA in the last 168 hours. Coagulation Profile: No results for input(s): INR, PROTIME in the last 168 hours. Cardiac Enzymes: No results for  input(s): CKTOTAL, CKMB, CKMBINDEX, TROPONINI in the last 168 hours. BNP (last 3 results) No results for input(s): PROBNP in the last 8760 hours. HbA1C: No results for input(s): HGBA1C in the last 72 hours. CBG: No results for input(s): GLUCAP in the last 168 hours. Lipid Profile: No results for input(s): CHOL, HDL, LDLCALC, TRIG, CHOLHDL, LDLDIRECT in the last 72 hours. Thyroid Function Tests: No results for input(s): TSH, T4TOTAL, FREET4, T3FREE, THYROIDAB in the last 72 hours. Anemia Panel: No results for input(s): VITAMINB12, FOLATE, FERRITIN, TIBC, IRON, RETICCTPCT in the last 72 hours. Urine analysis:    Component Value Date/Time   COLORURINE YELLOW 12/12/2019 1842   APPEARANCEUR CLEAR 12/12/2019 1842   LABSPEC 1.012 12/12/2019 1842   PHURINE 6.0 12/12/2019 1842   GLUCOSEU NEGATIVE 12/12/2019 1842   HGBUR NEGATIVE 12/12/2019 1842   BILIRUBINUR NEGATIVE 12/12/2019 1842   KETONESUR NEGATIVE 12/12/2019 1842   PROTEINUR NEGATIVE 12/12/2019 1842   NITRITE NEGATIVE 12/12/2019 1842   LEUKOCYTESUR NEGATIVE 12/12/2019 1842    Radiological Exams on Admission: CT Head Wo Contrast  Result Date: 12/12/2019 CLINICAL  DATA:  86 year female with altered mental status. EXAM: CT HEAD WITHOUT CONTRAST TECHNIQUE: Contiguous axial images were obtained from the base of the skull through the vertex without intravenous contrast. COMPARISON:  None. FINDINGS: Brain: There is mild age-related atrophy and moderate chronic microvascular ischemic changes. There is no acute intracranial hemorrhage. No mass effect or midline shift. No extra-axial fluid collection. Vascular: No hyperdense vessel or unexpected calcification. Skull: Normal. Negative for fracture or focal lesion. Sinuses/Orbits: No acute finding. Other: None IMPRESSION: 1. No acute intracranial hemorrhage. 2. Age-related atrophy and chronic microvascular ischemic changes. Electronically Signed   By: Anner Crete M.D.   On: 12/12/2019  17:40   MR BRAIN WO CONTRAST  Result Date: 12/12/2019 CLINICAL DATA:  Initial evaluation for acute ataxia, stroke suspected. EXAM: MRI HEAD WITHOUT CONTRAST TECHNIQUE: Multiplanar, multiecho pulse sequences of the brain and surrounding structures were obtained without intravenous contrast. COMPARISON:  Prior CT from earlier same day. FINDINGS: Brain: Examination technically limited as the patient was unable to tolerate the full length of the exam. Additionally, images provided are markedly degraded by motion. Generalized age-related cerebral atrophy. Patchy and confluent T2/FLAIR hyperintensity within the periventricular deep white matter most consistent with chronic small vessel ischemic disease, moderate nature. Multiple scattered superimposed remote lacunar infarcts present within the bilateral basal ganglia. Patchy small vessel changes noted within the pons as well. No abnormal foci of restricted diffusion to suggest acute or subacute ischemia. Gray-white matter differentiation maintained. No areas of remote cortical infarction. No definite evidence for acute intracranial hemorrhage. No mass lesion or midline shift. No hydrocephalus. No extra-axial fluid collection. Pituitary gland grossly normal. Midline structures intact. Vascular: Major intracranial vascular flow voids are grossly maintained at the skull base. Skull and upper cervical spine: Craniocervical junction within normal limits. Bone marrow signal intensity grossly normal. No scalp soft tissue abnormality. Sinuses/Orbits: Patient status post bilateral ocular lens replacement. Paranasal sinuses are grossly clear. No mastoid effusion. Other: None. IMPRESSION: 1. Technically limited exam due to motion artifact and the patient's inability to tolerate the full length of the study. 2. No acute intracranial infarct or other definite abnormality. 3. Age-related cerebral atrophy with moderate chronic microvascular ischemic disease, with multiple  superimposed remote lacunar infarcts within the bilateral basal ganglia. Electronically Signed   By: Jeannine Boga M.D.   On: 12/12/2019 22:56   DG Chest Portable 1 View  Result Date: 12/12/2019 CLINICAL DATA:  Weakness and cough. EXAM: PORTABLE CHEST 1 VIEW COMPARISON:  None. FINDINGS: Cardiomediastinal silhouette is normal. Mediastinal contours appear intact. Calcific atherosclerotic disease of the aorta. Nodular opacities in the right upper and lower lung fields may represent pulmonary nodules, pleural scarring or focal airspace consolidation. Osseous structures are without acute abnormality. Soft tissues are grossly normal. IMPRESSION: 1. Nodular opacities in the right upper and lower lung fields may represent pulmonary nodules, pleural scarring or focal airspace consolidation. 2. Calcific atherosclerotic disease of the aorta. Electronically Signed   By: Fidela Salisbury M.D.   On: 12/12/2019 17:43      Assessment/Plan Principal Problem:   Acute metabolic encephalopathy Patient's confusion is improving and she is awake, alert and oriented to place and person but not to time. CT head and my brain was negative for acute intracranial bleed or pathology but MRI showed chronic changes and lacunar infarcts in bilateral basal ganglia. New onset ambulatory dysfunction most likely secondary to generalized weakness from poor oral intake in the setting of Alzheimer dementia or poorly controlled hypertension. PT/OT consultation ordered.  Echocardiogram and bilateral carotid artery ultrasound ordered.  Active Problems: Hypertensive emergency Hypertensive emergency improved. IV labetalol 10 mg every 6 hours as needed if the systolic blood pressure is above 180 or diastolic above 105. As the patient has no history of of hypertension and not on any blood pressure medications, and should be started on an appropriate oral medication for blood pressure while discharging to home.    Ambulatory  dysfunction PT/OT consultation ordered.    COVID-19 virus infection  Remdesivir ordered Oxygen supplementation as needed with nasal cannula IV Decadron 6 mg daily for 10 days. Jatamansi and zinc ordered    DVT prophylaxis: Lovenox Code Status: Full code  Consults called: PT/OT consult ordered Admission status: Inpatient/MedSurg   Thalia Party MD [facility] Pager [phone]  If 7PM-7AM, please contact night-coverage [email or web address] Password   12/13/2019, 5:19 AM

## 2019-12-13 NOTE — Progress Notes (Signed)
  Echocardiogram 2D Echocardiogram has been performed.  Delcie Roch 12/13/2019, 11:40 AM

## 2019-12-13 NOTE — Progress Notes (Signed)
Pharmacy: Remdesivir   Patient is a 86 y.o. female with COVID.  Pharmacy has been consulted for remdesivir dosing.   - CXR shows "Nodular opacities in the right upper and lower lung fields may represent pulmonary nodules, pleural scarring or focal airspace consolidation."  - Pt requiring supplemental oxygen (No, 100%RA)  - ALT 16   A/P:  - Patient meets criteria for remdesivir. Will initiate remdesivir 200 mg once followed by 100 mg daily x 4 days.  - Daily CMET while on remdesivir - Will f/u pt's ALT and clinical condition

## 2019-12-13 NOTE — Progress Notes (Signed)
Pt son called and was updated on pt status. MD paged about giving son an update.

## 2019-12-13 NOTE — Progress Notes (Signed)
VASCULAR LAB PRELIMINARY  PRELIMINARY  PRELIMINARY  PRELIMINARY  Carotid duplex completed.    Preliminary report:  See CV proc for preliminary results.   Bader Stubblefield, RVT 12/13/2019, 2:55 PM

## 2019-12-14 LAB — COMPREHENSIVE METABOLIC PANEL
ALT: 15 U/L (ref 0–44)
AST: 23 U/L (ref 15–41)
Albumin: 3.7 g/dL (ref 3.5–5.0)
Alkaline Phosphatase: 77 U/L (ref 38–126)
Anion gap: 9 (ref 5–15)
BUN: 27 mg/dL — ABNORMAL HIGH (ref 8–23)
CO2: 24 mmol/L (ref 22–32)
Calcium: 9 mg/dL (ref 8.9–10.3)
Chloride: 102 mmol/L (ref 98–111)
Creatinine, Ser: 1 mg/dL (ref 0.44–1.00)
GFR calc Af Amer: 59 mL/min — ABNORMAL LOW (ref >60)
GFR calc non Af Amer: 51 mL/min — ABNORMAL LOW (ref >60)
Glucose, Bld: 95 mg/dL (ref 70–99)
Potassium: 4.1 mmol/L (ref 3.5–5.1)
Sodium: 135 mmol/L (ref 135–145)
Total Bilirubin: 0.5 mg/dL (ref 0.3–1.2)
Total Protein: 6.9 g/dL (ref 6.5–8.1)

## 2019-12-14 LAB — CBC WITH DIFFERENTIAL/PLATELET
Abs Immature Granulocytes: 0.05 10*3/uL (ref 0.00–0.07)
Basophils Absolute: 0 10*3/uL (ref 0.0–0.1)
Basophils Relative: 0 %
Eosinophils Absolute: 0 10*3/uL (ref 0.0–0.5)
Eosinophils Relative: 0 %
HCT: 37.2 % (ref 36.0–46.0)
Hemoglobin: 12.4 g/dL (ref 12.0–15.0)
Immature Granulocytes: 1 %
Lymphocytes Relative: 19 %
Lymphs Abs: 1.3 10*3/uL (ref 0.7–4.0)
MCH: 29.9 pg (ref 26.0–34.0)
MCHC: 33.3 g/dL (ref 30.0–36.0)
MCV: 89.6 fL (ref 80.0–100.0)
Monocytes Absolute: 0.9 10*3/uL (ref 0.1–1.0)
Monocytes Relative: 13 %
Neutro Abs: 4.6 10*3/uL (ref 1.7–7.7)
Neutrophils Relative %: 67 %
Platelets: 280 10*3/uL (ref 150–400)
RBC: 4.15 MIL/uL (ref 3.87–5.11)
RDW: 13 % (ref 11.5–15.5)
WBC: 6.7 10*3/uL (ref 4.0–10.5)
nRBC: 0 % (ref 0.0–0.2)

## 2019-12-14 LAB — D-DIMER, QUANTITATIVE: D-Dimer, Quant: 0.64 ug/mL-FEU — ABNORMAL HIGH (ref 0.00–0.50)

## 2019-12-14 LAB — URINE CULTURE: Culture: NO GROWTH

## 2019-12-14 LAB — C-REACTIVE PROTEIN: CRP: 1.5 mg/dL — ABNORMAL HIGH (ref <1.0)

## 2019-12-14 NOTE — Progress Notes (Signed)
Initial Nutrition Assessment  RD working remotely.   DOCUMENTATION CODES:   Underweight  INTERVENTION:  - continue Ensure Enlive BID, each supplement provides 350 kcal and 20 grams of protein. - will order Magic Cup BID with meals, each supplement provides 290 kcal and 9 grams of protein. - continue to encourage PO intakes. - would not recommend PEG given hx of Alzheimer's dementia, but could consider trial of appetite stimulant.     NUTRITION DIAGNOSIS:   Increased nutrient needs related to acute illness(COVID-19) as evidenced by estimated needs.  GOAL:   Patient will meet greater than or equal to 90% of their needs  MONITOR:   PO intake, Supplement acceptance, Labs, Weight trends  REASON FOR ASSESSMENT:   Malnutrition Screening Tool  ASSESSMENT:   86 y.o. female past medical history significant for COPD, Alzheimer's dementia, and pulmonary fibrosis (not oxygen dependent). He presented to the ED due to frontal headache, generalized weakness new cough, and dizziness with gait instability. His son reported he has also been having intermittent periods of poor appetite and confusion that is worse than his baseline. CXR showed bilateral multifocal infiltrates. Marland Kitchen  MRI of the brain was done that showed no acute infarcts but it did show chronic microvascular ischemic changes superimposed on remote lacunar bilateral basal ganglia infarcts.86 y.o. female past medical history significant for COPD, Alzheimer's dementia, and pulmonary fibrosis (not oxygen dependent). She presented to the ED due to frontal headache, generalized weakness new cough, and dizziness with gait instability. Her son reported she has also been having intermittent periods of poor appetite and confusion that is worse than baseline. CXR showed bilateral multifocal infiltrates. MRI of the brain was done that showed no acute infarcts but it did show chronic microvascular ischemic changes superimposed on remote lacunar  bilateral basal ganglia infarcts.  Patient ate 25% of breakfast, 0% of lunch, and 10% of dinner yesterday. He ate 10% of breakfast this AM. He is currently a/o to self and place only. Per chart review, weight on 1/17 was 98 lb and weight on 11/25/19 at [facility] was 104 lb. This indicates 6 lb weight loss (5.7% body weight) in the past 3 weeks; significant for time frame. Highly suspect patient meets criteria for some degree of malnutrition.   Per notes: - acute metabolic encephalopathy - son reported very poor PO intakes for several months - ambulatory dysfunction - PNA d/t COVID-19--remdesivir ordered; not a candidate for actemra or convalescent plasma   Labs reviewed; BUN: 27 mg/dl, GFR: 51 ml/min. Medications reviewed; 500 mg ascorbic acid/day, 1 mg folvite/day, 200 mg IV remdesivir x1 dose 1/17, 100 mg IV remdesivir x1 dose/day (1/18-1/21), 220 mg zinc sulfate/day.    NUTRITION - FOCUSED PHYSICAL EXAM:  unable to complete for COVID+ patient.   Diet Order:   Diet Order            Diet Heart Room service appropriate? Yes; Fluid consistency: Thin  Diet effective now              EDUCATION NEEDS:   No education needs have been identified at this time  Skin:  Skin Assessment: Reviewed RN Assessment  Last BM:  1/18  Height:   Ht Readings from Last 1 Encounters:  12/13/19 5\' 3"  (1.6 m)    Weight:   Wt Readings from Last 1 Encounters:  12/13/19 44.3 kg    Ideal Body Weight:  52.3 kg  BMI:  Body mass index is 17.3 kg/m.  Estimated Nutritional Needs:   Kcal:  8937-3428 kcal (33-37 kcal/kg)  Protein:  73-82 grams  Fluid:  >/= 1.8 L/day     Jarome Matin, MS, RD, LDN, [facility] Inpatient Clinical Dietitian Pager # [phone] After hours/weekend pager # [phone]

## 2019-12-14 NOTE — Evaluation (Signed)
Clinical/Bedside Swallow Evaluation Patient Details  Name: Amanda Sanford MRN: [id number] Date of Birth: 1932/02/07  Today's Date: 12/14/2019 Time: SLP Start Time (ACUTE ONLY): 1200 SLP Stop Time (ACUTE ONLY): 1230 SLP Time Calculation (min) (ACUTE ONLY): 30 min  Past Medical History: No past medical history on file. Past Surgical History: The histories are not reviewed yet. Please review them in the "History" navigator section and refresh this SmartLink. HPI:  86yo female admitted 12/12/19 with throbbing frontal headache and unstable gait. PMH: HTN, COPD, hypothyroid. Alzheimer's dementia, pulmonary fibrosis, GERD. MRI = negative for acute infarct, CXR = Nodular opacities in the right upper and lower lung fields may represent pulmonary nodules, pleural scarring or focal airspace consolidation.   Assessment / Plan / Recommendation Clinical Impression  Pt presents with adequate oral motor strength and function. She has upper and lower dentures. Pt was observed with thin liquids, puree, and solid textures. No overt s/s aspiration observed or reported on any consistency. Recommend continuing with regular diet/thin liquids. No further ST intervention is recommended at this time. Please reconsult if needs arise.    SLP Visit Diagnosis: Dysphagia, unspecified (R13.10)    Aspiration Risk  Mild aspiration risk    Diet Recommendation Regular;Thin liquid   Liquid Administration via: Cup;Straw Medication Administration: Whole meds with liquid Supervision: Patient able to self feed;Staff to assist with self feeding;Intermittent supervision to cue for compensatory strategies Compensations: Slow rate;Small sips/bites Postural Changes: Seated upright at 90 degrees;Remain upright for at least 30 minutes after po intake    Other  Recommendations Oral Care Recommendations: Oral care BID   Follow up Recommendations 24 hour supervision/assistance          Prognosis Prognosis for Safe Diet Advancement:  Good      Swallow Study   General Date of Onset: 12/12/19 HPI: 86yo female admitted 12/12/19 with throbbing frontal headache and unstable gait. PMH: HTN, COPD, hypothyroid. Alzheimer's dementia, pulmonary fibrosis, GERD. MRI = negative for acute infarct, CXR = Nodular opacities in the right upper and lower lung fields may represent pulmonary nodules, pleural scarring or focal airspace consolidation. Type of Study: Bedside Swallow Evaluation Previous Swallow Assessment: none Diet Prior to this Study: Regular;Thin liquids Temperature Spikes Noted: No Respiratory Status: Room air History of Recent Intubation: No Behavior/Cognition: Alert;Cooperative;Pleasant mood Oral Cavity Assessment: Within Functional Limits Oral Care Completed by SLP: No Oral Cavity - Dentition: Dentures, top;Dentures, bottom Vision: Functional for self-feeding Self-Feeding Abilities: Able to feed self Patient Positioning: Upright in bed Baseline Vocal Quality: Normal Volitional Cough: Weak Volitional Swallow: Able to elicit    Oral/Motor/Sensory Function     Ice Chips Ice chips: Not tested   Thin Liquid Thin Liquid: Within functional limits Presentation: Straw    Nectar Thick Nectar Thick Liquid: Not tested   Honey Thick Honey Thick Liquid: Not tested   Puree Puree: Within functional limits Presentation: Spoon   Solid     Solid: Within functional limits Presentation: Self Fed     Oniyah Rohe B. Murvin Natal, [facility], CCC-SLP Speech Language Pathologist Office: [phone] Pager: [phone]  Leigh Aurora 12/14/2019,1:18 PM

## 2019-12-14 NOTE — Evaluation (Signed)
Physical Therapy Evaluation Patient Details Name: Amanda Sanford MRN: [id number] DOB: 27-Apr-1932 Today's Date: 12/14/2019   History of Present Illness  86 yo female admitted 12/12/19 with throbbing frontal headache and unstable gait. MRI = negative for acute infarct but revealed lacunar infarcts in bilateral basal ganglia, CXR = Nodular opacities in the right upper and lower lung fields may represent pulmonary nodules, pleural scarring or focal airspace consolidation. PMH: HTN, COPD, hypothyroid. Alzheimer's dementia, pulmonary fibrosis, GERD.  Clinical Impression   Pt presents with LE weakness, difficulty performing mobility tasks, unsteadiness in standing, WFL SpO2, and decreased activity tolerance. Pt to benefit from acute PT to address deficits. Pt ambulated total of 75 ft with use of HHA and RW, pt limited by fatigue. PT recommending HHPT to address pt mobility deficits post-acutely and return pt to PLOF. PT to progress mobility as tolerated, and will continue to follow acutely.      Follow Up Recommendations Home health PT;Supervision for mobility/OOB    Equipment Recommendations  Rolling walker with 5" wheels    Recommendations for Other Services       Precautions / Restrictions Precautions Precautions: Fall Restrictions Weight Bearing Restrictions: No      Mobility  Bed Mobility Overal bed mobility: Needs Assistance Bed Mobility: Supine to Sit     Supine to sit: Min guard;HOB elevated     General bed mobility comments: min guard for safety, increased time and effort.  Transfers Overall transfer level: Needs assistance Equipment used: Rolling walker (2 wheeled);1 person hand held assist Transfers: Sit to/from Stand Sit to Stand: Min assist;Min guard         General transfer comment: Min assist with HHA stand for steadying, min guard for stand with use of RW as pt with self-steadying.  Ambulation/Gait Ambulation/Gait assistance: Min guard;Min assist Gait  Distance (Feet): 75 Feet Assistive device: Rolling walker (2 wheeled);1 person hand held assist Gait Pattern/deviations: Step-through pattern;Decreased stride length;Narrow base of support Gait velocity: decr   General Gait Details: Initially, min assist for steadying with HHA only (15 ft). PT encouraged ambulation with RW, pt then requiring min guard for safety with min verbal cuing for placement in RW (60 ft).  Stairs            Wheelchair Mobility    Modified Rankin (Stroke Patients Only)       Balance Overall balance assessment: Needs assistance Sitting-balance support: No upper extremity supported;Feet supported Sitting balance-Leahy Scale: Good     Standing balance support: Single extremity supported;During functional activity Standing balance-Leahy Scale: Poor Standing balance comment: requires light external support in standing                             Pertinent Vitals/Pain Pain Assessment: No/denies pain Pain Score: 0-No pain Pain Location: headache Pain Descriptors / Indicators: Aching Pain Intervention(s): Monitored during session    Home Living Family/patient expects to be discharged to:: Private residence Living Arrangements: Spouse/significant other;Children Available Help at Discharge: Family;Available PRN/intermittently Type of Home: House Home Access: Stairs to enter   [employer] of Steps: 10 Home Layout: Two level;Bed/bath upstairs Home Equipment: Cane - single point      Prior Function Level of Independence: Independent         Comments: pt reports not needing assist for mobility PTA, but per chart review pt was walking with AD per son. Pt reports she and her husband live with her son and have for "a  couple of months"     Hand Dominance   Dominant Hand: Right    Extremity/Trunk Assessment   Upper Extremity Assessment Upper Extremity Assessment: Overall WFL for tasks assessed    Lower Extremity  Assessment Lower Extremity Assessment: Generalized weakness    Cervical / Trunk Assessment Cervical / Trunk Assessment: Normal  Communication   Communication: No difficulties  Cognition Arousal/Alertness: Awake/alert Behavior During Therapy: WFL for tasks assessed/performed Overall Cognitive Status: History of cognitive impairments - at baseline                                 General Comments: Pt with history of dementia, pt oriented to self and location but states "how long have I been here, 5 days?" when she was admitted yesterday. Pt difficulty remembering home set up at son's, and some subjective information given by pt appears to be in conflict with information provided by pt's son      General Comments General comments (skin integrity, edema, etc.): SpO2 96-98% on RA during mobility, HR stable    Exercises     Assessment/Plan    PT Assessment Patient needs continued PT services  PT Problem List Decreased strength;Decreased mobility;Decreased activity tolerance;Decreased balance;Decreased knowledge of use of DME;Pain;Decreased safety awareness       PT Treatment Interventions DME instruction;Therapeutic activities;Gait training;Therapeutic exercise;Patient/family education;Balance training;Stair training;Functional mobility training    PT Goals (Current goals can be found in the Care Plan section)  Acute Rehab PT Goals Patient Stated Goal: go home to family PT Goal Formulation: With patient Time For Goal Achievement: 12/28/19 Potential to Achieve Goals: Good    Frequency Min 3X/week   Barriers to discharge        Co-evaluation               AM-PAC PT "6 Clicks" Mobility  Outcome Measure Help needed turning from your back to your side while in a flat bed without using bedrails?: A Little Help needed moving from lying on your back to sitting on the side of a flat bed without using bedrails?: A Little Help needed moving to and from a bed to a  chair (including a wheelchair)?: A Little Help needed standing up from a chair using your arms (e.g., wheelchair or bedside chair)?: A Little Help needed to walk in hospital room?: A Little Help needed climbing 3-5 steps with a railing? : A Lot 6 Click Score: 17    End of Session Equipment Utilized During Treatment: Gait belt Activity Tolerance: Patient tolerated treatment well;Patient limited by fatigue Patient left: in chair;with chair alarm set;with call bell/phone within reach Nurse Communication: Mobility status PT Visit Diagnosis: Other abnormalities of gait and mobility (R26.89);Unsteadiness on feet (R26.81)    Time: 2353-6144 PT Time Calculation (min) (ACUTE ONLY): 21 min   Charges:   PT Evaluation $PT Eval Low Complexity: 1 Low          Dmarion Perfect E, PT Acute Rehabilitation Services Pager [phone]  Office [phone]  Jesica Goheen D Elonda Husky 12/14/2019, 4:43 PM

## 2019-12-14 NOTE — Progress Notes (Signed)
[facility] PROGRESS NOTE    Progress Note  Amanda Sanford  SAY:[id number] DOB: 10-Jun-1932 DOA: 12/12/2019 PCP: System, Pcp Not In     Brief Narrative:   Amanda Sanford is an 86 y.o. female past medical history significant for COPD, Alzheimer's dementia pulmonary fibrosis not oxygen dependent, comes into the ED for frontal headache, generalized weakness and gait instability.  The patient is a poor historian due to her advanced dementia and most of the history was obtained from the ED physician's note.  The patient has been having unstable gait with ambulation, dizziness poor appetite and intermittent episodes of confusion as per son.  New dry cough but denies fever chills nausea vomiting or diarrhea.  In the ED she was satting 100% on room air her SARS-CoV-2 PCR was positive chest x-ray showed bilateral multifocal infiltrates.  MRI of the brain was done that showed no acute infarcts but it did show chronic microvascular ischemic changes superimposed on remote lacunar bilateral basal ganglia infarcts.  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 As speaking to the son yesterday the patient has had significant poor oral intake for the last several months. Encephalopathy has now resolved. She is talkative this morning able to carry on a conversation and speak in full sentences.  Hypertensive urgency: Patient was noncompliant with her medication she was started on IV antihypertensive medication her blood pressure is improved She was started on oral hydrochlorothiazide Norvasc and lisinopril was weaned off her antihypertensive medication her blood pressure is now 130/80- 108/87. Continue IV hydralazine and labetalol as needed. We will discontinue hydralazine her goal blood pressure is  140/90.  Ambulatory dysfunction: PT OT has been consulted.  Pneumonia due to COVID-19 Her saturations have been greater than 96% on room air, her chest x-ray did show multifocal bilateral infiltrates, her SARS-CoV-2 PCR was positive on admission. Continue IV remdesivir, discontinue steroids as she has not become hypoxic.  She is not a candidate for IV Actemra or convalescent plasma. Continue to use incentive spirometry and flutter valve.    DVT prophylaxis: lovenox Family Communication:Husband Disposition Plan/Barrier to D/C: Once she has completed treatment for IV remdesivir. Where the patient is from  Anticipated d/c place.  Barriers to d/c OR conditions which need to be met to effect a safe d/c.  Code Status:     Code Status Orders  (From admission, onward)         Start     Ordered   12/13/19 0207  Full code  Continuous     12/13/19 0220        Code Status History    This patient has a current code status but no historical code status.   Advance Care Planning Activity        IV Access:    Peripheral IV   Procedures and diagnostic studies:   CT Head Wo Contrast  Result Date: 12/12/2019 CLINICAL DATA:  86 year female with altered mental status. EXAM: CT HEAD WITHOUT CONTRAST TECHNIQUE: Contiguous axial images were obtained from the base of the skull through the vertex without intravenous contrast. COMPARISON:  None. FINDINGS: Brain: There is mild age-related atrophy and moderate chronic microvascular ischemic changes. There is no acute intracranial hemorrhage. No mass effect or midline shift. No extra-axial fluid collection. Vascular: No hyperdense vessel or unexpected calcification. Skull: Normal. Negative  for fracture or focal lesion. Sinuses/Orbits: No acute finding. Other: None IMPRESSION: 1. No acute intracranial hemorrhage. 2. Age-related atrophy and chronic microvascular ischemic changes. Electronically Signed   By: Anner Crete M.D.   On:  12/12/2019 17:40   MR BRAIN WO CONTRAST  Result Date: 12/12/2019 CLINICAL DATA:  Initial evaluation for acute ataxia, stroke suspected. EXAM: MRI HEAD WITHOUT CONTRAST TECHNIQUE: Multiplanar, multiecho pulse sequences of the brain and surrounding structures were obtained without intravenous contrast. COMPARISON:  Prior CT from earlier same day. FINDINGS: Brain: Examination technically limited as the patient was unable to tolerate the full length of the exam. Additionally, images provided are markedly degraded by motion. Generalized age-related cerebral atrophy. Patchy and confluent T2/FLAIR hyperintensity within the periventricular deep white matter most consistent with chronic small vessel ischemic disease, moderate nature. Multiple scattered superimposed remote lacunar infarcts present within the bilateral basal ganglia. Patchy small vessel changes noted within the pons as well. No abnormal foci of restricted diffusion to suggest acute or subacute ischemia. Gray-white matter differentiation maintained. No areas of remote cortical infarction. No definite evidence for acute intracranial hemorrhage. No mass lesion or midline shift. No hydrocephalus. No extra-axial fluid collection. Pituitary gland grossly normal. Midline structures intact. Vascular: Major intracranial vascular flow voids are grossly maintained at the skull base. Skull and upper cervical spine: Craniocervical junction within normal limits. Bone marrow signal intensity grossly normal. No scalp soft tissue abnormality. Sinuses/Orbits: Patient status post bilateral ocular lens replacement. Paranasal sinuses are grossly clear. No mastoid effusion. Other: None. IMPRESSION: 1. Technically limited exam due to motion artifact and the patient's inability to tolerate the full length of the study. 2. No acute intracranial infarct or other definite abnormality. 3. Age-related cerebral atrophy with moderate chronic microvascular ischemic disease, with multiple  superimposed remote lacunar infarcts within the bilateral basal ganglia. Electronically Signed   By: Jeannine Boga M.D.   On: 12/12/2019 22:56   DG Chest Portable 1 View  Result Date: 12/12/2019 CLINICAL DATA:  Weakness and cough. EXAM: PORTABLE CHEST 1 VIEW COMPARISON:  None. FINDINGS: Cardiomediastinal silhouette is normal. Mediastinal contours appear intact. Calcific atherosclerotic disease of the aorta. Nodular opacities in the right upper and lower lung fields may represent pulmonary nodules, pleural scarring or focal airspace consolidation. Osseous structures are without acute abnormality. Soft tissues are grossly normal. IMPRESSION: 1. Nodular opacities in the right upper and lower lung fields may represent pulmonary nodules, pleural scarring or focal airspace consolidation. 2. Calcific atherosclerotic disease of the aorta. Electronically Signed   By: Fidela Salisbury M.D.   On: 12/12/2019 17:43   VAS US CAROTID  Result Date: 12/13/2019 Carotid Arterial Duplex Study Indications:       Covid-19, hypertensive emergency, dizziness, gait                    disturbance. Risk Factors:      Hyperlipidemia. Comparison Study:  No prior study Performing Technologist: Sharion Dove RVS  Examination Guidelines: A complete evaluation includes B-mode imaging, spectral Doppler, color Doppler, and power Doppler as needed of all accessible portions of each vessel. Bilateral testing is considered an integral part of a complete examination. Limited examinations for reoccurring indications may be performed as noted.  Right Carotid Findings: +----------+--------+--------+--------+------------------+--------+           PSV cm/sEDV cm/sStenosisPlaque DescriptionComments +----------+--------+--------+--------+------------------+--------+ CCA Prox  52      8               homogeneous                +----------+--------+--------+--------+------------------+--------+  CCA Distal42      9                heterogenous               +----------+--------+--------+--------+------------------+--------+ ICA Prox  130     34      1-39%   calcific                   +----------+--------+--------+--------+------------------+--------+ ICA Mid   88      18                                         +----------+--------+--------+--------+------------------+--------+ ICA Distal59      20                                         +----------+--------+--------+--------+------------------+--------+ ECA       139     13                                         +----------+--------+--------+--------+------------------+--------+ +----------+--------+-------+--------+-------------------+           PSV cm/sEDV cmsDescribeArm Pressure (mmHG) +----------+--------+-------+--------+-------------------+ [id number]                                         +----------+--------+-------+--------+-------------------+ +---------+--------+--+--------+--+ VertebralPSV cm/s42EDV cm/s10 +---------+--------+--+--------+--+  Left Carotid Findings: +----------+--------+--------+--------+------------------+------------------+           PSV cm/sEDV cm/sStenosisPlaque DescriptionComments           +----------+--------+--------+--------+------------------+------------------+ CCA Prox  44      11                                intimal thickening +----------+--------+--------+--------+------------------+------------------+ CCA Distal48      12              heterogenous                         +----------+--------+--------+--------+------------------+------------------+ ICA Prox  59      14      1-39%   heterogenous                         +----------+--------+--------+--------+------------------+------------------+ ICA Distal72      22                                                   +----------+--------+--------+--------+------------------+------------------+ ECA       64       10                                                   +----------+--------+--------+--------+------------------+------------------+ +----------+--------+--------+--------+-------------------+           PSV cm/sEDV cm/sDescribeArm Pressure (  mmHG) +----------+--------+--------+--------+-------------------+ Subclavian60                                          +----------+--------+--------+--------+-------------------+ +---------+--------+--+--------+--+ VertebralPSV cm/s56EDV cm/s15 +---------+--------+--+--------+--+  Summary: Right Carotid: Velocities in the right ICA are consistent with a 1-39% stenosis. Left Carotid: Velocities in the left ICA are consistent with a 1-39% stenosis. Vertebrals:  Bilateral vertebral arteries demonstrate antegrade flow. Subclavians: Normal flow hemodynamics were seen in bilateral subclavian              arteries. *See table(s) above for measurements and observations.  Electronically signed by Monica Martinez MD on 12/13/2019 at 3:56:38 PM.    Final    ECHOCARDIOGRAM LIMITED  Result Date: 12/13/2019   ECHOCARDIOGRAM LIMITED REPORT   Patient Name:   SARYE KATH Date of Exam: 12/13/2019 Medical Rec #:  [id number]    Height:       63.0 in Accession #:    [id number]   Weight:       97.7 lb Date of Birth:  02-25-1932    BSA:          1.43 m Patient Age:    15 years     BP:           181/77 mmHg Patient Gender: F            HR:           66 bpm. Exam Location:  Inpatient  Procedure: Limited Echo, Saline Contrast Bubble Study, Limited Color Doppler and            Cardiac Doppler Indications:    TIA 435.9  History:        Patient has no prior history of Echocardiogram examinations.  Sonographer:    Johny Chess Referring Phys: [id number] [place]  1. Left ventricular ejection fraction, by visual estimation, is 55 to 60%. The left ventricle has normal function. There is no increased left ventricular wall thickness.  2. Left ventricular diastolic  parameters are consistent with Grade I diastolic dysfunction (impaired relaxation).  3. The left ventricle has no regional wall motion abnormalities.  4. Global right ventricle has normal systolc function.The right ventricular size is normal. no increase in right ventricular wall thickness.  5. Mild mitral valve prolapse.  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 Mild mitral valve regurgitation.  7. Tricuspid valve regurgitation mild-moderate.  8. Tricuspid valve regurgitation mild-moderate.  9. Aortic valve regurgitation is mild to moderate. 10. Aortic dilatation noted. 11. There is mild dilatation of the aortic root measuring 41 mm. 12. Mildly elevated pulmonary artery systolic pressure. 13. The inferior vena cava is normal in size with greater than 50% respiratory variability, suggesting right atrial pressure of 3 mmHg. FINDINGS  Left Ventricle: Left ventricular ejection fraction, by visual estimation, is 55 to 60%. The left ventricle has normal function. The left ventricle has no regional wall motion abnormalities. The left ventricular internal cavity size was the left ventricle is normal in size. There is no increased left ventricular wall thickness. Left ventricular diastolic parameters are consistent with Grade I diastolic dysfunction (impaired relaxation). Normal left atrial pressure. Right Ventricle: The right ventricular size is normal. No increase in right ventricular wall thickness. Global RV systolic function is has normal systolic function. The tricuspid regurgitant velocity is 2.76 m/s, and with an assumed right atrial pressure  of 3 mmHg, the estimated  right ventricular systolic pressure is mildly elevated at 33.5 mmHg. Left Atrium: Left atrial size was normal in size. Right Atrium: Right atrial size was normal in size. Right atrial pressure is estimated at 3 mmHg. Mitral Valve: The mitral valve is myxomatous. There is mild late systolic prolapse of of the mitral valve. There is mild thickening of the  mitral valve leaflet(s). MV Area by PHT, 2.09 cm. MV PHT, 105.27 msec. Mild mitral valve regurgitation. Tricuspid Valve: Tricuspid valve regurgitation mild-moderate. Aortic Valve: The aortic valve is tricuspid. Aortic valve regurgitation is mild to moderate. Aortic regurgitation PHT measures 500 msec. Pulmonic Valve: The pulmonic valve was grossly normal. Pulmonic valve regurgitation is trivial by color flow Doppler. Pulmonic regurgitation is trivial by color flow Doppler. Aorta: Aortic dilatation noted. There is mild dilatation of the aortic root measuring 41 mm. Venous: The inferior vena cava is normal in size with greater than 50% respiratory variability, suggesting right atrial pressure of 3 mmHg. Shunts: Agitated saline contrast was given intravenously to evaluate for intracardiac shunting. Saline contrast bubble study was negative, with no evidence of any interatrial shunt. No atrial level shunt detected by color flow Doppler.  LEFT VENTRICLE         Normals PLAX 2D LVIDd:         4.10 cm         Diastology                 Normals LVIDs:         2.90 cm         LV e' lateral:   8.59 cm/s LV PW:         0.80 cm         LV E/e' lateral: 9.1 LV IVS:        0.80 cm         LV e' medial:    5.66 cm/s LV SV:         42 ml           LV E/e' medial:  13.9 LV SV Index:   30.15  LEFT ATRIUM         Index LA diam:    2.10 cm  AORTIC VALVE             Normals LVOT Vmax:   88.10 cm/s LVOT Vmean:  58.700 cm/s LVOT VTI:    0.190 m AI PHT:      500 msec  AORTA                 Normals Ao Root diam: 4.10 cm Ao Asc diam:  3.50 cm MITRAL VALVE               Normals TRICUSPID VALVE             Normals MV Area (PHT): 2.09 cm            TR Peak grad:   30.5 mmHg MV PHT:        105.27 msec         TR Vmax:        276.00 cm/s MV Decel Time: 363 msec MV E velocity: 78.50 cm/s          SHUNTS                                    Systemic VTI: 0.19 m  Mihai Croitoru  MD Electronically signed by Sanda Klein MD Signature Date/Time:  12/13/2019/12:05:16 PM    Final      Medical Consultants:    None.  Anti-Infectives:   IV remdesivir  Subjective:    Amanda Sanford awake this morning with no complaints she relates she is hungry.  Objective:    Vitals:   12/13/19 0545 12/13/19 1334 12/13/19 2018 12/14/19 0518  BP:  (!) 148/78 108/87 130/80  Pulse:  72 78 64  Resp:  16 17 15   Temp:  98.1 F (36.7 C) 98.1 F (36.7 C) 98.2 F (36.8 C)  TempSrc:  Oral Oral Oral  SpO2:  100% 100% 100%  Weight: 44.3 kg     Height: 5' 3"  (1.6 m)      SpO2: 100 %   Intake/Output Summary (Last 24 hours) at 12/14/2019 0802 Last data filed at 12/13/2019 1816 Gross per 24 hour  Intake 300 ml  Output 0 ml  Net 300 ml   Filed Weights   12/13/19 0545  Weight: 44.3 kg    Exam: General exam: In no acute distress. Respiratory system: Good air movement and clear to auscultation. Cardiovascular system: S1 & S2 heard, RRR. No JVD. Gastrointestinal system: Abdomen is nondistended, soft and nontender.  Central nervous system: Alert and oriented. No focal neurological deficits. Extremities: No pedal edema. Skin: No rashes, lesions or ulcers  Data Reviewed:    Labs: Basic Metabolic Panel: Recent Labs  Lab 12/12/19 1751 12/13/19 0330 12/14/19 0232  NA 134*  --  135  K 3.9  --  4.1  CL 98  --  102  CO2 24  --  24  GLUCOSE 91  --  95  BUN 22  --  27*  CREATININE 1.03* 0.92 1.00  CALCIUM 9.2  --  9.0   GFR Estimated Creatinine Clearance: 28.8 mL/min (by C-G formula based on SCr of 1 mg/dL). Liver Function Tests: Recent Labs  Lab 12/12/19 1751 12/14/19 0232  AST 26 23  ALT 16 15  ALKPHOS 87 77  BILITOT 0.8 0.5  PROT 7.9 6.9  ALBUMIN 4.3 3.7   No results for input(s): LIPASE, AMYLASE in the last 168 hours. No results for input(s): AMMONIA in the last 168 hours. Coagulation profile No results for input(s): INR, PROTIME in the last 168 hours. COVID-19 Labs  Recent Labs    12/14/19 0232  DDIMER 0.64*    CRP 1.5*    No results found for: SARSCOV2NAA  CBC: Recent Labs  Lab 12/12/19 1751 12/13/19 0330 12/14/19 0232  WBC 9.0 7.4 6.7  NEUTROABS 6.2  --  4.6  HGB 12.6 12.6 12.4  HCT 38.5 38.4 37.2  MCV 90.2 89.7 89.6  PLT 267 278 280   Cardiac Enzymes: No results for input(s): CKTOTAL, CKMB, CKMBINDEX, TROPONINI in the last 168 hours. BNP (last 3 results) No results for input(s): PROBNP in the last 8760 hours. CBG: No results for input(s): GLUCAP in the last 168 hours. D-Dimer: Recent Labs    12/14/19 0232  DDIMER 0.64*   Hgb A1c: No results for input(s): HGBA1C in the last 72 hours. Lipid Profile: No results for input(s): CHOL, HDL, LDLCALC, TRIG, CHOLHDL, LDLDIRECT in the last 72 hours. Thyroid function studies: No results for input(s): TSH, T4TOTAL, T3FREE, THYROIDAB in the last 72 hours.  Invalid input(s): FREET3 Anemia work up: No results for input(s): VITAMINB12, FOLATE, FERRITIN, TIBC, IRON, RETICCTPCT in the last 72 hours. Sepsis Labs: Recent Labs  Lab 12/12/19 1751 12/13/19 0330 12/14/19  0232  WBC 9.0 7.4 6.7   Microbiology No results found for this or any previous visit (from the past 240 hour(s)).   Medications:   . sodium chloride   Intravenous Once  . amLODipine  5 mg Oral Daily  . vitamin C  500 mg Oral Daily  . dexamethasone (DECADRON) injection  6 mg Intravenous Q24H  . enoxaparin (LOVENOX) injection  30 mg Subcutaneous Q24H  . feeding supplement (ENSURE ENLIVE)  237 mL Oral BID BM  . folic acid  1 mg Oral Daily  . hydrochlorothiazide  12.5 mg Oral Daily  . lisinopril  5 mg Oral Daily  . multivitamin with minerals  1 tablet Oral Daily  . zinc sulfate  220 mg Oral Daily   Continuous Infusions: . remdesivir 100 mg in NS 100 mL       LOS: 1 day   Charlynne Cousins  [facility]  12/14/2019, 8:02 AM

## 2019-12-15 LAB — CBC WITH DIFFERENTIAL/PLATELET
Abs Immature Granulocytes: 0.05 10*3/uL (ref 0.00–0.07)
Basophils Absolute: 0 10*3/uL (ref 0.0–0.1)
Basophils Relative: 0 %
Eosinophils Absolute: 0 10*3/uL (ref 0.0–0.5)
Eosinophils Relative: 0 %
HCT: 36.6 % (ref 36.0–46.0)
Hemoglobin: 11.9 g/dL — ABNORMAL LOW (ref 12.0–15.0)
Immature Granulocytes: 1 %
Lymphocytes Relative: 18 %
Lymphs Abs: 1.8 10*3/uL (ref 0.7–4.0)
MCH: 29.1 pg (ref 26.0–34.0)
MCHC: 32.5 g/dL (ref 30.0–36.0)
MCV: 89.5 fL (ref 80.0–100.0)
Monocytes Absolute: 1.2 10*3/uL — ABNORMAL HIGH (ref 0.1–1.0)
Monocytes Relative: 11 %
Neutro Abs: 7.1 10*3/uL (ref 1.7–7.7)
Neutrophils Relative %: 70 %
Platelets: 321 10*3/uL (ref 150–400)
RBC: 4.09 MIL/uL (ref 3.87–5.11)
RDW: 12.9 % (ref 11.5–15.5)
WBC: 10.2 10*3/uL (ref 4.0–10.5)
nRBC: 0 % (ref 0.0–0.2)

## 2019-12-15 LAB — COMPREHENSIVE METABOLIC PANEL
ALT: 13 U/L (ref 0–44)
AST: 23 U/L (ref 15–41)
Albumin: 3.6 g/dL (ref 3.5–5.0)
Alkaline Phosphatase: 74 U/L (ref 38–126)
Anion gap: 9 (ref 5–15)
BUN: 26 mg/dL — ABNORMAL HIGH (ref 8–23)
CO2: 22 mmol/L (ref 22–32)
Calcium: 8.9 mg/dL (ref 8.9–10.3)
Chloride: 103 mmol/L (ref 98–111)
Creatinine, Ser: 0.96 mg/dL (ref 0.44–1.00)
GFR calc Af Amer: >60 mL/min (ref >60)
GFR calc non Af Amer: 54 mL/min — ABNORMAL LOW (ref >60)
Glucose, Bld: 102 mg/dL — ABNORMAL HIGH (ref 70–99)
Potassium: 3.7 mmol/L (ref 3.5–5.1)
Sodium: 134 mmol/L — ABNORMAL LOW (ref 135–145)
Total Bilirubin: 0.4 mg/dL (ref 0.3–1.2)
Total Protein: 6.4 g/dL — ABNORMAL LOW (ref 6.5–8.1)

## 2019-12-15 LAB — C-REACTIVE PROTEIN: CRP: 1 mg/dL — ABNORMAL HIGH (ref <1.0)

## 2019-12-15 LAB — D-DIMER, QUANTITATIVE: D-Dimer, Quant: 0.55 ug/mL-FEU — ABNORMAL HIGH (ref 0.00–0.50)

## 2019-12-15 NOTE — Progress Notes (Signed)
Dr. Robb Matar notified of diarrhea stool x3 occurrences today. No new orders noted. Will continue to monitor.

## 2019-12-15 NOTE — Progress Notes (Signed)
[facility] PROGRESS NOTE    Progress Note  Amanda Sanford  VFI:[id number] DOB: 07/26/1932 DOA: 12/12/2019 PCP: System, Pcp Not In     Brief Narrative:   Amanda Sanford is an 86 y.o. female past medical history significant for COPD, Alzheimer's dementia pulmonary fibrosis not oxygen dependent, comes into the ED for frontal headache, generalized weakness and gait instability.  The patient is a poor historian due to her advanced dementia and most of the history was obtained from the ED physician's note.  The patient has been having unstable gait with ambulation, dizziness poor appetite and intermittent episodes of confusion as per son.  New dry cough but denies fever chills nausea vomiting or diarrhea.  In the ED she was satting 100% on room air her SARS-CoV-2 PCR was positive chest x-ray showed bilateral multifocal infiltrates.  MRI of the brain was done that showed no acute infarcts but it did show chronic microvascular ischemic changes superimposed on remote lacunar bilateral basal ganglia infarcts.  Assessment/Plan:   Acute metabolic encephalopathy: In the setting of COVID-19 pneumonia. CT of the head was negative for acute intracranial pathologies, MRI of the brain was done that showed no acute findings.  But it did show microvascular changes and to old lacunar bilateral basal ganglial infarcts. Patient encephalopathy has resolved with conservative management.  Hypertensive urgency: She is noncompliant with her medication, as to show, she was started on hydrochlorothiazide Norvasc and lisinopril and was wean off IV antihypertensive medication her blood pressure has been stable and in great control.  Ambulatory dysfunction: PT OT has been consulted.  Pneumonia due to COVID-19 Saturations have remained greater than 96% on room air throughout her hospital stay, her chest x-ray did show multifocal bilateral infiltrates with a SARS-CoV-2 PCR positive on admission. She was started on IV  remdesivir and sh relates he  Breathing is better    DVT prophylaxis: lovenox Family Communication: Son Disposition Plan/Barrier to D/C: Once she has completed treatment for IV remdesivir.  She will go  to her skilled nursing facility.  Code Status:     Code Status Orders  (From admission, onward)         Start     Ordered   12/13/19 0207  Full code  Continuous     12/13/19 0220        Code Status History    This patient has a current code status but no historical code status.   Advance Care Planning Activity        IV Access:    Peripheral IV   Procedures and diagnostic studies:   VAS US CAROTID  Result Date: 12/13/2019 Carotid Arterial Duplex Study Indications:       Covid-19, hypertensive emergency, dizziness, gait                    disturbance. Risk Factors:      Hyperlipidemia. Comparison Study:  No prior study Performing Technologist: Sherren Kerns RVS  Examination Guidelines: A complete evaluation includes B-mode imaging, spectral Doppler, color Doppler, and power Doppler as needed of all accessible portions of each vessel. Bilateral testing is considered an integral part of a complete examination. Limited examinations for reoccurring indications may be performed as noted.  Right Carotid Findings: +----------+--------+--------+--------+------------------+--------+           PSV cm/sEDV cm/sStenosisPlaque DescriptionComments +----------+--------+--------+--------+------------------+--------+ CCA Prox  52      8               homogeneous                +----------+--------+--------+--------+------------------+--------+  CCA Distal42      9               heterogenous               +----------+--------+--------+--------+------------------+--------+ ICA Prox  130     34      1-39%   calcific                   +----------+--------+--------+--------+------------------+--------+ ICA Mid   88      18                                          +----------+--------+--------+--------+------------------+--------+ ICA Distal59      20                                         +----------+--------+--------+--------+------------------+--------+ ECA       139     13                                         +----------+--------+--------+--------+------------------+--------+ +----------+--------+-------+--------+-------------------+           PSV cm/sEDV cmsDescribeArm Pressure (mmHG) +----------+--------+-------+--------+-------------------+ [id number]                                         +----------+--------+-------+--------+-------------------+ +---------+--------+--+--------+--+ VertebralPSV cm/s42EDV cm/s10 +---------+--------+--+--------+--+  Left Carotid Findings: +----------+--------+--------+--------+------------------+------------------+           PSV cm/sEDV cm/sStenosisPlaque DescriptionComments           +----------+--------+--------+--------+------------------+------------------+ CCA Prox  44      11                                intimal thickening +----------+--------+--------+--------+------------------+------------------+ CCA Distal48      12              heterogenous                         +----------+--------+--------+--------+------------------+------------------+ ICA Prox  59      14      1-39%   heterogenous                         +----------+--------+--------+--------+------------------+------------------+ ICA Distal72      22                                                   +----------+--------+--------+--------+------------------+------------------+ ECA       64      10                                                   +----------+--------+--------+--------+------------------+------------------+ +----------+--------+--------+--------+-------------------+           PSV cm/sEDV cm/sDescribeArm Pressure (mmHG)  +----------+--------+--------+--------+-------------------+  Subclavian60                                          +----------+--------+--------+--------+-------------------+ +---------+--------+--+--------+--+ VertebralPSV cm/s56EDV cm/s15 +---------+--------+--+--------+--+  Summary: Right Carotid: Velocities in the right ICA are consistent with a 1-39% stenosis. Left Carotid: Velocities in the left ICA are consistent with a 1-39% stenosis. Vertebrals:  Bilateral vertebral arteries demonstrate antegrade flow. Subclavians: Normal flow hemodynamics were seen in bilateral subclavian              arteries. *See table(s) above for measurements and observations.  Electronically signed by Sherald Hesshristopher Clark MD on 12/13/2019 at 3:56:38 PM.    Final    ECHOCARDIOGRAM LIMITED  Result Date: 12/13/2019   ECHOCARDIOGRAM LIMITED REPORT   Patient Name:   Amanda SinnerDOROTHY Sanford Date of Exam: 12/13/2019 Medical Rec #:  [id number]    Height:       63.0 in Accession #:    [id number]   Weight:       97.7 lb Date of Birth:  June 06, 1932    BSA:          1.43 m Patient Age:    87 years     BP:           181/77 mmHg Patient Gender: F            HR:           66 bpm. Exam Location:  Inpatient  Procedure: Limited Echo, Saline Contrast Bubble Study, Limited Color Doppler and            Cardiac Doppler Indications:    TIA 435.9  History:        Patient has no prior history of Echocardiogram examinations.  Sonographer:    Delcie RochLauren Pennington Referring Phys: [id number] MOHAMMAD Z KHAN IMPRESSIONS  1. Left ventricular ejection fraction, by visual estimation, is 55 to 60%. The left ventricle has normal function. There is no increased left ventricular wall thickness.  2. Left ventricular diastolic parameters are consistent with Grade I diastolic dysfunction (impaired relaxation).  3. The left ventricle has no regional wall motion abnormalities.  4. Global right ventricle has normal systolc function.The right ventricular size is normal. no increase in  right ventricular wall thickness.  5. Mild mitral valve prolapse.  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 Mild mitral valve regurgitation.  7. Tricuspid valve regurgitation mild-moderate.  8. Tricuspid valve regurgitation mild-moderate.  9. Aortic valve regurgitation is mild to moderate. 10. Aortic dilatation noted. 11. There is mild dilatation of the aortic root measuring 41 mm. 12. Mildly elevated pulmonary artery systolic pressure. 13. The inferior vena cava is normal in size with greater than 50% respiratory variability, suggesting right atrial pressure of 3 mmHg. FINDINGS  Left Ventricle: Left ventricular ejection fraction, by visual estimation, is 55 to 60%. The left ventricle has normal function. The left ventricle has no regional wall motion abnormalities. The left ventricular internal cavity size was the left ventricle is normal in size. There is no increased left ventricular wall thickness. Left ventricular diastolic parameters are consistent with Grade I diastolic dysfunction (impaired relaxation). Normal left atrial pressure. Right Ventricle: The right ventricular size is normal. No increase in right ventricular wall thickness. Global RV systolic function is has normal systolic function. The tricuspid regurgitant velocity is 2.76 m/s, and with an assumed right atrial pressure  of 3 mmHg, the estimated right ventricular  systolic pressure is mildly elevated at 33.5 mmHg. Left Atrium: Left atrial size was normal in size. Right Atrium: Right atrial size was normal in size. Right atrial pressure is estimated at 3 mmHg. Mitral Valve: The mitral valve is myxomatous. There is mild late systolic prolapse of of the mitral valve. There is mild thickening of the mitral valve leaflet(s). MV Area by PHT, 2.09 cm. MV PHT, 105.27 msec. Mild mitral valve regurgitation. Tricuspid Valve: Tricuspid valve regurgitation mild-moderate. Aortic Valve: The aortic valve is tricuspid. Aortic valve regurgitation is mild to moderate.  Aortic regurgitation PHT measures 500 msec. Pulmonic Valve: The pulmonic valve was grossly normal. Pulmonic valve regurgitation is trivial by color flow Doppler. Pulmonic regurgitation is trivial by color flow Doppler. Aorta: Aortic dilatation noted. There is mild dilatation of the aortic root measuring 41 mm. Venous: The inferior vena cava is normal in size with greater than 50% respiratory variability, suggesting right atrial pressure of 3 mmHg. Shunts: Agitated saline contrast was given intravenously to evaluate for intracardiac shunting. Saline contrast bubble study was negative, with no evidence of any interatrial shunt. No atrial level shunt detected by color flow Doppler.  LEFT VENTRICLE         Normals PLAX 2D LVIDd:         4.10 cm         Diastology                 Normals LVIDs:         2.90 cm         LV e' lateral:   8.59 cm/s LV PW:         0.80 cm         LV E/e' lateral: 9.1 LV IVS:        0.80 cm         LV e' medial:    5.66 cm/s LV SV:         42 ml           LV E/e' medial:  13.9 LV SV Index:   30.15  LEFT ATRIUM         Index LA diam:    2.10 cm  AORTIC VALVE             Normals LVOT Vmax:   88.10 cm/s LVOT Vmean:  58.700 cm/s LVOT VTI:    0.190 m AI PHT:      500 msec  AORTA                 Normals Ao Root diam: 4.10 cm Ao Asc diam:  3.50 cm MITRAL VALVE               Normals TRICUSPID VALVE             Normals MV Area (PHT): 2.09 cm            TR Peak grad:   30.5 mmHg MV PHT:        105.27 msec         TR Vmax:        276.00 cm/s MV Decel Time: 363 msec MV E velocity: 78.50 cm/s          SHUNTS                                    Systemic VTI: 0.19 m  Thurmon Fair MD Electronically  signed by Thurmon Fair MD Signature Date/Time: 12/13/2019/12:05:16 PM    Final      Medical Consultants:    None.  Anti-Infectives:   IV remdesivir  Subjective:    Amanda Sanford is sleepy this morning but has no new complaints.  Objective:    Vitals:   12/14/19 0518 12/14/19 1259 12/14/19  2128 12/15/19 0524  BP: 130/80 122/60 116/60 118/75  Pulse: 64 65 64 68  Resp: 15 17 17 19   Temp: 98.2 F (36.8 C) 98 F (36.7 C) 98.6 F (37 C) 98 F (36.7 C)  TempSrc: Oral Oral Oral Oral  SpO2: 100% 100% 98% 100%  Weight:      Height:       SpO2: 100 %   Intake/Output Summary (Last 24 hours) at 12/15/2019 0757 Last data filed at 12/15/2019 0600 Gross per 24 hour  Intake 549.86 ml  Output --  Net 549.86 ml   Filed Weights   12/13/19 0545  Weight: 44.3 kg    Exam: General exam: In no acute distress. Respiratory system: Good air movement and clear to auscultation. Cardiovascular system: S1 & S2 heard, RRR. No JVD. Gastrointestinal system: Abdomen is nondistended, soft and nontender.  Central nervous system: Alert and oriented.  Extremities: No pedal edema. Skin: No rashes, lesions or ulcers   Data Reviewed:    Labs: Basic Metabolic Panel: Recent Labs  Lab 12/12/19 1751 12/12/19 1751 12/13/19 0330 12/14/19 0232 12/15/19 0214  NA 134*  --   --  135 134*  K 3.9   < >  --  4.1 3.7  CL 98  --   --  102 103  CO2 24  --   --  24 22  GLUCOSE 91  --   --  95 102*  BUN 22  --   --  27* 26*  CREATININE 1.03*  --  0.92 1.00 0.96  CALCIUM 9.2  --   --  9.0 8.9   < > = values in this interval not displayed.   GFR Estimated Creatinine Clearance: 30 mL/min (by C-G formula based on SCr of 0.96 mg/dL). Liver Function Tests: Recent Labs  Lab 12/12/19 1751 12/14/19 0232 12/15/19 0214  AST 26 23 23   ALT 16 15 13   ALKPHOS 87 77 74  BILITOT 0.8 0.5 0.4  PROT 7.9 6.9 6.4*  ALBUMIN 4.3 3.7 3.6   No results for input(s): LIPASE, AMYLASE in the last 168 hours. No results for input(s): AMMONIA in the last 168 hours. Coagulation profile No results for input(s): INR, PROTIME in the last 168 hours. COVID-19 Labs  Recent Labs    12/14/19 0232 12/15/19 0214  DDIMER 0.64* 0.55*  CRP 1.5* 1.0*    No results found for: SARSCOV2NAA  CBC: Recent Labs  Lab  12/12/19 1751 12/13/19 0330 12/14/19 0232 12/15/19 0214  WBC 9.0 7.4 6.7 10.2  NEUTROABS 6.2  --  4.6 7.1  HGB 12.6 12.6 12.4 11.9*  HCT 38.5 38.4 37.2 36.6  MCV 90.2 89.7 89.6 89.5  PLT 267 278 280 321   Cardiac Enzymes: No results for input(s): CKTOTAL, CKMB, CKMBINDEX, TROPONINI in the last 168 hours. BNP (last 3 results) No results for input(s): PROBNP in the last 8760 hours. CBG: No results for input(s): GLUCAP in the last 168 hours. D-Dimer: Recent Labs    12/14/19 0232 12/15/19 0214  DDIMER 0.64* 0.55*   Hgb A1c: No results for input(s): HGBA1C in the last 72 hours. Lipid Profile: No results for  input(s): CHOL, HDL, LDLCALC, TRIG, CHOLHDL, LDLDIRECT in the last 72 hours. Thyroid function studies: No results for input(s): TSH, T4TOTAL, T3FREE, THYROIDAB in the last 72 hours.  Invalid input(s): FREET3 Anemia work up: No results for input(s): VITAMINB12, FOLATE, FERRITIN, TIBC, IRON, RETICCTPCT in the last 72 hours. Sepsis Labs: Recent Labs  Lab 12/12/19 1751 12/13/19 0330 12/14/19 0232 12/15/19 0214  WBC 9.0 7.4 6.7 10.2   Microbiology Recent Results (from the past 240 hour(s))  Urine culture     Status: None   Collection Time: 12/12/19  6:42 PM   Specimen: Urine, Random  Result Value Ref Range Status   Specimen Description   Final    URINE, RANDOM Performed at [facility], [address] [place], [place] [address]    Special Requests   Final    NONE Performed at [facility], [address] [place], [place] [address]    Culture   Final    NO GROWTH Performed at [facility], [address] [place], [place] [address]    Report Status 12/14/2019 FINAL  Final     Medications:   . sodium chloride   Intravenous Once  . amLODipine  5 mg Oral Daily  . vitamin C  500 mg Oral Daily  . enoxaparin (LOVENOX) injection  30 mg Subcutaneous Q24H  . feeding supplement (ENSURE ENLIVE)  237 mL Oral BID BM  .  folic acid  1 mg Oral Daily  . lisinopril  5 mg Oral Daily  . multivitamin with minerals  1 tablet Oral Daily  . zinc sulfate  220 mg Oral Daily   Continuous Infusions: . remdesivir 100 mg in NS 100 mL 100 mg (12/14/19 0958)     LOS: 2 days   Marinda ElkAbraham Feliz Ortiz  [facility]  12/15/2019, 7:57 AM

## 2019-12-15 NOTE — Plan of Care (Signed)

## 2019-12-16 DIAGNOSIS — F0280 Dementia in other diseases classified elsewhere without behavioral disturbance: Secondary | ICD-10-CM

## 2019-12-16 DIAGNOSIS — I160 Hypertensive urgency: Secondary | ICD-10-CM

## 2019-12-16 DIAGNOSIS — I161 Hypertensive emergency: Secondary | ICD-10-CM

## 2019-12-16 LAB — CBC WITH DIFFERENTIAL/PLATELET
Abs Immature Granulocytes: 0.07 10*3/uL (ref 0.00–0.07)
Basophils Absolute: 0 10*3/uL (ref 0.0–0.1)
Basophils Relative: 0 %
Eosinophils Absolute: 0.1 10*3/uL (ref 0.0–0.5)
Eosinophils Relative: 1 %
HCT: 37.3 % (ref 36.0–46.0)
Hemoglobin: 12.1 g/dL (ref 12.0–15.0)
Immature Granulocytes: 1 %
Lymphocytes Relative: 17 %
Lymphs Abs: 1.7 10*3/uL (ref 0.7–4.0)
MCH: 29.8 pg (ref 26.0–34.0)
MCHC: 32.4 g/dL (ref 30.0–36.0)
MCV: 91.9 fL (ref 80.0–100.0)
Monocytes Absolute: 1.3 10*3/uL — ABNORMAL HIGH (ref 0.1–1.0)
Monocytes Relative: 13 %
Neutro Abs: 6.9 10*3/uL (ref 1.7–7.7)
Neutrophils Relative %: 68 %
Platelets: 318 10*3/uL (ref 150–400)
RBC: 4.06 MIL/uL (ref 3.87–5.11)
RDW: 13 % (ref 11.5–15.5)
WBC: 10.2 10*3/uL (ref 4.0–10.5)
nRBC: 0.2 % (ref 0.0–0.2)

## 2019-12-16 LAB — COMPREHENSIVE METABOLIC PANEL
ALT: 15 U/L (ref 0–44)
AST: 23 U/L (ref 15–41)
Albumin: 3.4 g/dL — ABNORMAL LOW (ref 3.5–5.0)
Alkaline Phosphatase: 69 U/L (ref 38–126)
Anion gap: 9 (ref 5–15)
BUN: 29 mg/dL — ABNORMAL HIGH (ref 8–23)
CO2: 22 mmol/L (ref 22–32)
Calcium: 8.9 mg/dL (ref 8.9–10.3)
Chloride: 103 mmol/L (ref 98–111)
Creatinine, Ser: 0.85 mg/dL (ref 0.44–1.00)
GFR calc Af Amer: >60 mL/min (ref >60)
GFR calc non Af Amer: >60 mL/min (ref >60)
Glucose, Bld: 102 mg/dL — ABNORMAL HIGH (ref 70–99)
Potassium: 3.7 mmol/L (ref 3.5–5.1)
Sodium: 134 mmol/L — ABNORMAL LOW (ref 135–145)
Total Bilirubin: 0.6 mg/dL (ref 0.3–1.2)
Total Protein: 6.3 g/dL — ABNORMAL LOW (ref 6.5–8.1)

## 2019-12-16 LAB — D-DIMER, QUANTITATIVE: D-Dimer, Quant: 0.44 ug/mL-FEU (ref 0.00–0.50)

## 2019-12-16 LAB — C-REACTIVE PROTEIN: CRP: 1.3 mg/dL — ABNORMAL HIGH (ref <1.0)

## 2019-12-16 NOTE — Progress Notes (Signed)
Physical Therapy Treatment Patient Details Name: Amanda Sanford MRN: [id number] DOB: 1932-09-05 Today's Date: 12/16/2019    History of Present Illness 86 yo female admitted 12/12/19 with throbbing frontal headache and unstable gait. MRI = negative for acute infarct but revealed lacunar infarcts in bilateral basal ganglia, CXR = Nodular opacities in the right upper and lower lung fields may represent pulmonary nodules, pleural scarring or focal airspace consolidation. PMH: HTN, COPD, hypothyroid. Alzheimer's dementia, pulmonary fibrosis, GERD.    PT Comments    Pt with improved ambulation distance this session, with stable O2 sats on RA 92-98%. Pt required occasional min assist for bed mobility, but overall required min guard for safety only. Pt is eager to d/c home with family, PT continues to feel pt is appropriate for HHPT level of care. Will continue to follow acutely.    Follow Up Recommendations  Home health PT;Supervision for mobility/OOB     Equipment Recommendations  Rolling walker with 5" wheels    Recommendations for Other Services       Precautions / Restrictions Precautions Precautions: Fall Restrictions Weight Bearing Restrictions: No    Mobility  Bed Mobility Overal bed mobility: Needs Assistance Bed Mobility: Supine to Sit     Supine to sit: HOB elevated;Min guard     General bed mobility comments: min guard for safety, increased time to perform.  Transfers Overall transfer level: Needs assistance Equipment used: Rolling walker (2 wheeled) Transfers: Sit to/from Stand Sit to Stand: Min guard         General transfer comment: min guard for safety, verbal cuing for hand placement when rising.  Ambulation/Gait Ambulation/Gait assistance: Min guard Gait Distance (Feet): 475 Feet Assistive device: Rolling walker (2 wheeled) Gait Pattern/deviations: Step-through pattern;Decreased stride length Gait velocity: decr   General Gait Details: min guard for  safety, verbal cuing for placement in RW x4 during ambulation and hallway navigation.   Stairs             Wheelchair Mobility    Modified Rankin (Stroke Patients Only)       Balance Overall balance assessment: Needs assistance Sitting-balance support: No upper extremity supported;Feet supported Sitting balance-Leahy Scale: Good     Standing balance support: Single extremity supported;During functional activity Standing balance-Leahy Scale: Poor Standing balance comment: requires light external support in standing                            Cognition Arousal/Alertness: Awake/alert Behavior During Therapy: WFL for tasks assessed/performed Overall Cognitive Status: History of cognitive impairments - at baseline                                 General Comments: Pt asks PT x5 "when do I get out of here?", history of dementia      Exercises      General Comments General comments (skin integrity, edema, etc.): sats 92-98% on RA during ambulation      Pertinent Vitals/Pain Pain Assessment: No/denies pain Pain Score: 0-No pain Pain Intervention(s): Monitored during session    Home Living                      Prior Function            PT Goals (current goals can now be found in the care plan section) Acute Rehab PT Goals Patient Stated Goal: go home to  family PT Goal Formulation: With patient Time For Goal Achievement: 12/28/19 Potential to Achieve Goals: Good Progress towards PT goals: Progressing toward goals    Frequency    Min 3X/week      PT Plan Current plan remains appropriate    Co-evaluation              AM-PAC PT "6 Clicks" Mobility   Outcome Measure  Help needed turning from your back to your side while in a flat bed without using bedrails?: A Little Help needed moving from lying on your back to sitting on the side of a flat bed without using bedrails?: A Little Help needed moving to and from a  bed to a chair (including a wheelchair)?: A Little Help needed standing up from a chair using your arms (e.g., wheelchair or bedside chair)?: A Little Help needed to walk in hospital room?: A Little Help needed climbing 3-5 steps with a railing? : A Little 6 Click Score: 18    End of Session Equipment Utilized During Treatment: Gait belt Activity Tolerance: Patient tolerated treatment well;Patient limited by fatigue Patient left: in chair;with chair alarm set;with call bell/phone within reach Nurse Communication: Mobility status PT Visit Diagnosis: Other abnormalities of gait and mobility (R26.89);Unsteadiness on feet (R26.81)     Time: 3614-4315 PT Time Calculation (min) (ACUTE ONLY): 20 min  Charges:  $Gait Training: 8-22 mins                     Dalessandro Baldyga E, PT Acute Rehabilitation Services Pager [phone]  Office [phone]   Judene Logue D Despina Hidden 12/16/2019, 4:50 PM

## 2019-12-16 NOTE — Progress Notes (Signed)
PROGRESS NOTE    Sheng Pritz   JJO:[id number]  DOB: [birth year]  DOA: 12/12/2019 PCP: System, Pcp Not In   Brief Narrative:  Amanda Sanford is an 86 y.o. female past medical history significant for COPD, Alzheimer's dementia pulmonary fibrosis not oxygen dependent, comes into the ED for frontal headache, generalized weakness and gait instability.  The patient is a poor historian due to her advanced dementia and most of the history was obtained from the ED physician's note.  The patient has been having unstable gait with ambulation, dizziness poor appetite and intermittent episodes of confusion as per son.  New dry cough but denies fever chills nausea vomiting or diarrhea.  In the ED she was satting 100% on room air her SARS-CoV-2 PCR was positive chest x-ray showed bilateral multifocal infiltrates.  MRI of the brain was done that showed no acute infarcts but it did show chronic microvascular ischemic changes superimposed on remote lacunar bilateral basal ganglia infarcts.   Subjective: She has no complaints.     Assessment & Plan:   Principal Problem:   Acute metabolic encephalopathy - Underlying dementia with acute confusion related to COVID 19 infection - appears improved  Active Problems:    Pneumonia due to COVID-19 virus - Not hypoxic but with b/l infiltrates on CXR suggestive of pneumonia - Continue Remdesivir started on 1/17 - cont Zinc and Vit C as well    Hypertensive urgency - improved- cont Norvasc and Lisinopril    Ambulatory dysfunction - will need HHPT when discharged  Time spent in minutes: 35 DVT prophylaxis: Lovenox Code Status: Full code Family Communication:  Disposition Plan: home tomorrow Consultants:   none Procedures:   none Antimicrobials:  Anti-infectives (From admission, onward)   Start     Dose/Rate Route Frequency Ordered Stop   12/14/19 1000  remdesivir 100 mg in sodium chloride 0.9 % 100 mL IVPB     100 mg 200 mL/hr over 30 Minutes  Intravenous Daily 12/13/19 0220 12/18/19 0959   12/13/19 0215  remdesivir 200 mg in sodium chloride 0.9% 250 mL IVPB     200 mg 580 mL/hr over 30 Minutes Intravenous Once 12/13/19 0220 12/13/19 0525       Objective: Vitals:   12/15/19 1451 12/15/19 2001 12/16/19 0457 12/16/19 1324  BP: 115/62 120/73 115/79 110/65  Pulse: 70 71 74 72  Resp: 16 18 16 16   Temp: 97.7 F (36.5 C) 98.9 F (37.2 C) 98.4 F (36.9 C) 98.1 F (36.7 C)  TempSrc: Oral Oral Oral Oral  SpO2: 97% 99% 99% 100%  Weight:      Height:        Intake/Output Summary (Last 24 hours) at 12/16/2019 1506 Last data filed at 12/16/2019 1324 Gross per 24 hour  Intake 450 ml  Output 0 ml  Net 450 ml   Filed Weights   12/13/19 0545  Weight: 44.3 kg    Examination: General exam: Appears comfortable  HEENT: PERRLA, oral mucosa moist, no sclera icterus or thrush Respiratory system: Clear to auscultation. Respiratory effort normal. Cardiovascular system: S1 & S2 heard, RRR.   Gastrointestinal system: Abdomen soft, non-tender, nondistended. Normal bowel sounds. Central nervous system: Alert and oriented to person only. No focal neurological deficits. Extremities: No cyanosis, clubbing or edema Skin: No rashes or ulcers Psychiatry:  Mood & affect appropriate.     Data Reviewed: I have personally reviewed following labs and imaging studies  CBC: Recent Labs  Lab 12/12/19 1751 12/13/19 0330 12/14/19 0232 12/15/19 0214  12/16/19 0150  WBC 9.0 7.4 6.7 10.2 10.2  NEUTROABS 6.2  --  4.6 7.1 6.9  HGB 12.6 12.6 12.4 11.9* 12.1  HCT 38.5 38.4 37.2 36.6 37.3  MCV 90.2 89.7 89.6 89.5 91.9  PLT 267 278 280 321 270   Basic Metabolic Panel: Recent Labs  Lab 12/12/19 1751 12/13/19 0330 12/14/19 0232 12/15/19 0214 12/16/19 0150  NA 134*  --  135 134* 134*  K 3.9  --  4.1 3.7 3.7  CL 98  --  102 103 103  CO2 24  --  24 22 22   GLUCOSE 91  --  95 102* 102*  BUN 22  --  27* 26* 29*  CREATININE 1.03* 0.92 1.00  0.96 0.85  CALCIUM 9.2  --  9.0 8.9 8.9   GFR: Estimated Creatinine Clearance: 33.8 mL/min (by C-G formula based on SCr of 0.85 mg/dL). Liver Function Tests: Recent Labs  Lab 12/12/19 1751 12/14/19 0232 12/15/19 0214 12/16/19 0150  AST 26 23 23 23   ALT 16 15 13 15   ALKPHOS 87 77 74 69  BILITOT 0.8 0.5 0.4 0.6  PROT 7.9 6.9 6.4* 6.3*  ALBUMIN 4.3 3.7 3.6 3.4*   No results for input(s): LIPASE, AMYLASE in the last 168 hours. No results for input(s): AMMONIA in the last 168 hours. Coagulation Profile: No results for input(s): INR, PROTIME in the last 168 hours. Cardiac Enzymes: No results for input(s): CKTOTAL, CKMB, CKMBINDEX, TROPONINI in the last 168 hours. BNP (last 3 results) No results for input(s): PROBNP in the last 8760 hours. HbA1C: No results for input(s): HGBA1C in the last 72 hours. CBG: No results for input(s): GLUCAP in the last 168 hours. Lipid Profile: No results for input(s): CHOL, HDL, LDLCALC, TRIG, CHOLHDL, LDLDIRECT in the last 72 hours. Thyroid Function Tests: No results for input(s): TSH, T4TOTAL, FREET4, T3FREE, THYROIDAB in the last 72 hours. Anemia Panel: No results for input(s): VITAMINB12, FOLATE, FERRITIN, TIBC, IRON, RETICCTPCT in the last 72 hours. Urine analysis:    Component Value Date/Time   COLORURINE YELLOW 12/12/2019 1842   APPEARANCEUR CLEAR 12/12/2019 1842   LABSPEC 1.012 12/12/2019 1842   PHURINE 6.0 12/12/2019 1842   GLUCOSEU NEGATIVE 12/12/2019 1842   HGBUR NEGATIVE 12/12/2019 1842   BILIRUBINUR NEGATIVE 12/12/2019 1842   KETONESUR NEGATIVE 12/12/2019 1842   PROTEINUR NEGATIVE 12/12/2019 1842   NITRITE NEGATIVE 12/12/2019 1842   LEUKOCYTESUR NEGATIVE 12/12/2019 1842   Sepsis Labs: @LABRCNTIP (procalcitonin:4,lacticidven:4) ) Recent Results (from the past 240 hour(s))  Urine culture     Status: None   Collection Time: 12/12/19  6:42 PM   Specimen: Urine, Random  Result Value Ref Range Status   Specimen Description    Final    URINE, RANDOM Performed at [facility], [place] [address] [place], [place] [address]    Special Requests   Final    NONE Performed at [facility], [address], [place], [place] [address]    Culture   Final    NO GROWTH Performed at [facility], [place] [address] [place], [place] [address]    Report Status 12/14/2019 FINAL  Final         Radiology Studies: No results found.    Scheduled Meds: . sodium chloride   Intravenous Once  . amLODipine  5 mg Oral Daily  . vitamin C  500 mg Oral Daily  . enoxaparin (LOVENOX) injection  30 mg Subcutaneous Q24H  . feeding supplement (ENSURE ENLIVE)  237 mL Oral BID BM  . folic acid  1 mg Oral Daily  . lisinopril  5 mg Oral Daily  . multivitamin with minerals  1 tablet Oral Daily  . zinc sulfate  220 mg Oral Daily   Continuous Infusions: . remdesivir 100 mg in NS 100 mL 100 mg (12/16/19 1029)     LOS: 3 days      Calvert Cantor, MD [facility] Pager: [email or web address] Password [facility] 12/16/2019, 3:06 PM

## 2019-12-16 NOTE — Progress Notes (Signed)
Patient reports being hungry. Denies remembering lunch tray provided. Reoriented patient to place and time. Ice cream offered to patient. Patient agreed to try to eat. Upon entrance to room with food, patient did not remember previous discussion. Reminded of need to eat. Patient began consuming items provided. Given coffee per request.  Will continue to monitor and orient patient as needed.

## 2019-12-16 NOTE — Plan of Care (Signed)

## 2019-12-17 LAB — D-DIMER, QUANTITATIVE: D-Dimer, Quant: 0.51 ug/mL-FEU — ABNORMAL HIGH (ref 0.00–0.50)

## 2019-12-17 LAB — COMPREHENSIVE METABOLIC PANEL
ALT: 18 U/L (ref 0–44)
AST: 21 U/L (ref 15–41)
Albumin: 3.2 g/dL — ABNORMAL LOW (ref 3.5–5.0)
Alkaline Phosphatase: 75 U/L (ref 38–126)
Anion gap: 10 (ref 5–15)
BUN: 28 mg/dL — ABNORMAL HIGH (ref 8–23)
CO2: 22 mmol/L (ref 22–32)
Calcium: 9.1 mg/dL (ref 8.9–10.3)
Chloride: 103 mmol/L (ref 98–111)
Creatinine, Ser: 0.88 mg/dL (ref 0.44–1.00)
GFR calc Af Amer: >60 mL/min (ref >60)
GFR calc non Af Amer: 60 mL/min — ABNORMAL LOW (ref >60)
Glucose, Bld: 95 mg/dL (ref 70–99)
Potassium: 3.9 mmol/L (ref 3.5–5.1)
Sodium: 135 mmol/L (ref 135–145)
Total Bilirubin: 0.4 mg/dL (ref 0.3–1.2)
Total Protein: 5.9 g/dL — ABNORMAL LOW (ref 6.5–8.1)

## 2019-12-17 LAB — CBC WITH DIFFERENTIAL/PLATELET
Abs Immature Granulocytes: 0.09 10*3/uL — ABNORMAL HIGH (ref 0.00–0.07)
Basophils Absolute: 0.1 10*3/uL (ref 0.0–0.1)
Basophils Relative: 0 %
Eosinophils Absolute: 0.2 10*3/uL (ref 0.0–0.5)
Eosinophils Relative: 2 %
HCT: 35.7 % — ABNORMAL LOW (ref 36.0–46.0)
Hemoglobin: 11.4 g/dL — ABNORMAL LOW (ref 12.0–15.0)
Immature Granulocytes: 1 %
Lymphocytes Relative: 15 %
Lymphs Abs: 1.7 10*3/uL (ref 0.7–4.0)
MCH: 29 pg (ref 26.0–34.0)
MCHC: 31.9 g/dL (ref 30.0–36.0)
MCV: 90.8 fL (ref 80.0–100.0)
Monocytes Absolute: 1.4 10*3/uL — ABNORMAL HIGH (ref 0.1–1.0)
Monocytes Relative: 12 %
Neutro Abs: 7.9 10*3/uL — ABNORMAL HIGH (ref 1.7–7.7)
Neutrophils Relative %: 70 %
Platelets: 313 10*3/uL (ref 150–400)
RBC: 3.93 MIL/uL (ref 3.87–5.11)
RDW: 13 % (ref 11.5–15.5)
WBC: 11.3 10*3/uL — ABNORMAL HIGH (ref 4.0–10.5)
nRBC: 0 % (ref 0.0–0.2)

## 2019-12-17 LAB — C-REACTIVE PROTEIN: CRP: 1.1 mg/dL — ABNORMAL HIGH (ref <1.0)

## 2019-12-17 MED ORDER — ENSURE ENLIVE PO LIQD: 237.0000 mL | Freq: Two times a day (BID) | ORAL | 12 refills | Status: AC

## 2019-12-17 MED ORDER — FOLIC ACID 1 MG PO TABS: 1.0000 mg | ORAL_TABLET | Freq: Every day | ORAL | 0 refills | Status: AC

## 2019-12-17 MED ORDER — ASCORBIC ACID 500 MG PO TABS: 500.0000 mg | ORAL_TABLET | Freq: Every day | ORAL | 0 refills | Status: AC

## 2019-12-17 MED ORDER — ADULT MULTIVITAMIN W/MINERALS CH: 1.0000 | ORAL_TABLET | Freq: Every day | ORAL | 0 refills | Status: AC

## 2019-12-17 MED ORDER — LISINOPRIL 5 MG PO TABS: 5.0000 mg | ORAL_TABLET | Freq: Every day | ORAL | 0 refills | Status: DC

## 2019-12-17 MED ORDER — ZINC SULFATE 220 (50 ZN) MG PO CAPS: 220.0000 mg | ORAL_CAPSULE | Freq: Every day | ORAL | 0 refills | Status: AC

## 2019-12-17 MED ORDER — AMLODIPINE BESYLATE 5 MG PO TABS: 5.0000 mg | ORAL_TABLET | Freq: Every day | ORAL | 0 refills | Status: DC

## 2019-12-17 NOTE — Progress Notes (Signed)
Spoke with son, Elijah Birk. He is on his way to take patient home with him.

## 2019-12-17 NOTE — Progress Notes (Signed)
Spoke with son, Elijah Birk regarding DC to home orders for today. Informed of Home Health and assistive device arrangements for patient. He verbalized understanding. Relative states he will be here to take patient home as soon as her walker arrives. Patient resting quietly in room. No s/s of acute distress noted. Will continue to monitor.

## 2019-12-17 NOTE — Plan of Care (Signed)
Patient education completed. Discharge to home today with [facility] services. Awaiting arrival of walker for patient. Son, Elijah Birk to come to hospital and drive patient to home. Patient resting without s/s of acute distress noted. Will continue to monitor.

## 2019-12-17 NOTE — Plan of Care (Signed)
DC instructions given to son Elijah Birk. Understanding verbalized.

## 2019-12-17 NOTE — TOC Transition Note (Signed)
Transition of Care [facility]) - CM/SW Discharge Note   Patient Details  Name: Amanda Sanford MRN: [id number] Date of Birth: 28-Jan-1932  [facility] CM/SW Contact:  Darleene Cleaver, LCSW Phone Number: 12/17/2019, 5:34 PM   Clinical Narrative:     Patient will be going home with home health through [facility].  CSW signing off please reconsult with any other social work needs, home health agency has been notified of planned discharge.   Final next level of care: Home w Home Health Services Barriers to Discharge: Barriers Resolved   Patient Goals and CMS Choice Patient states their goals for this hospitalization and ongoing recovery are:: To return back home with home health services CMS Medicare.gov Compare Post Acute Care list provided to:: Patient Choice offered to / list presented to : Patient  Discharge Placement                       Discharge Plan and Services In-house Referral: Clinical Social Work              DME Arranged: Dan Humphreys rolling with seat DME Agency: [employer] Date DME Agency Contacted: 12/17/19 Time DME Agency Contacted: 1400 Representative spoke with at DME Agency: Zack HH Arranged: PT HH Agency: [facility] Date [facility] Agency Contacted: 12/17/19 Time HH Agency Contacted: 1400 Representative spoke with at [facility] Agency: Amy  Social Determinants of Health (SDOH) Interventions     Readmission Risk Interventions No flowsheet data found.

## 2019-12-17 NOTE — Discharge Summary (Signed)
Physician Discharge Summary  Amanda Sanford UTM:[id number] DOB: 1932-11-13 DOA: 12/12/2019  PCP: System, Pcp Not In  Admit date: 12/12/2019 Discharge date: 12/17/2019  Admitted From: home Disposition:  home   Recommendations for Outpatient Follow-up:  1. F/y BP and compliance with medications  Home Health:  ordered     Discharge Condition:  stable   CODE STATUS:  Full code   Diet recommendation:  Heart healthy Consultations:  none    Discharge Diagnoses:  Principal Problem:   Acute metabolic encephalopathy Active Problems:   Ambulatory dysfunction   Pneumonia due to COVID-19 virus   Alzheimer disease (HCC)   Hypertensive urgency     Brief Summary: Amanda Sanford is an 86 y.o.femalepast medical history significant for COPD, Alzheimer's dementia pulmonary fibrosis not oxygen dependent, comes into the ED for frontal headache, generalized weakness and gait instability. The patient is a poor historian due to her advanced dementia and most of the history was obtained from the ED physician's note. The patient has been having unstable gait with ambulation, dizziness poor appetite and intermittent episodes of confusion as per son. New dry cough but denies fever chills nausea vomiting or diarrhea. In the ED she was satting 100% on room air her SARS-CoV-2 PCR was positive chest x-ray showed bilateral multifocal infiltrates. MRI of the brain was done that showed no acute infarcts but it did show chronic microvascular ischemic changes superimposed on remote lacunar bilateral basal ganglia infarcts.  Hospital Course:  Principal Problem:   Acute metabolic encephalopathy - Underlying dementia with acute confusion related to COVID 19 infection - appears improved- she is stable to go to home with family  Active Problems:    Pneumonia due to COVID-19 virus - Not hypoxic but with b/l infiltrates on CXR suggestive of pneumonia -   Remdesivir started on 1/17 and completed  5 days - cont  Zinc and Vit C as well    Hypertensive urgency - improved- cont Norvasc and Lisinopril    Ambulatory dysfunction - will need HHPT      Discharge Exam: Vitals:   12/16/19 2123 12/17/19 0639  BP: 132/70 114/83  Pulse: 68 82  Resp: 18 17  Temp: 98.9 F (37.2 C) 98.6 F (37 C)  SpO2: 97% 99%   Vitals:   12/16/19 0457 12/16/19 1324 12/16/19 2123 12/17/19 0639  BP: 115/79 110/65 132/70 114/83  Pulse: 74 72 68 82  Resp: 16 16 18 17   Temp: 98.4 F (36.9 C) 98.1 F (36.7 C) 98.9 F (37.2 C) 98.6 F (37 C)  TempSrc: Oral Oral Oral Oral  SpO2: 99% 100% 97% 99%  Weight:      Height:        General: Pt is alert, awake, not in acute distress Cardiovascular: RRR, S1/S2 +, no rubs, no gallops Respiratory: CTA bilaterally, no wheezing, no rhonchi Abdominal: Soft, NT, ND, bowel sounds + Extremities: no edema, no cyanosis   Discharge Instructions  Discharge Instructions    Diet - low sodium heart healthy   Complete by: As directed    For home use only DME 4 wheeled rolling walker with seat   Complete by: As directed    Patient needs a walker to treat with the following condition: Debilitated   Increase activity slowly   Complete by: As directed      Allergies as of 12/17/2019   No Known Allergies     Medication List    TAKE these medications   amLODipine 5 MG tablet Commonly known as:  NORVASC Take 1 tablet (5 mg total) by mouth daily.   ascorbic acid 500 MG tablet Commonly known as: VITAMIN C Take 1 tablet (500 mg total) by mouth daily.   cyanocobalamin 1000 MCG/ML injection Commonly known as: (VITAMIN B-12) Inject 1 mL into the muscle every 7 (seven) days.   feeding supplement (ENSURE ENLIVE) Liqd Take 237 mLs by mouth 2 (two) times daily between meals.   folic acid 1 MG tablet Commonly known as: FOLVITE Take 1 tablet (1 mg total) by mouth daily.   levothyroxine 75 MCG tablet Commonly known as: SYNTHROID Take 75 mcg by mouth daily.   lisinopril 5  MG tablet Commonly known as: ZESTRIL Take 1 tablet (5 mg total) by mouth daily.   memantine 5 MG tablet Commonly known as: NAMENDA Take 5 mg by mouth 2 (two) times daily. AM and PM   multivitamin with minerals Tabs tablet Take 1 tablet by mouth daily.   sertraline 50 MG tablet Commonly known as: ZOLOFT Take 50 mg by mouth daily.   zinc sulfate 220 (50 Zn) MG capsule Take 1 capsule (220 mg total) by mouth daily.            Durable Medical Equipment  (From admission, onward)         Start     Ordered   12/17/19 0000  For home use only DME 4 wheeled rolling walker with seat    Question:  Patient needs a walker to treat with the following condition  Answer:  Debilitated   12/17/19 0920          No Known Allergies   Procedures/Studies:    CT Head Wo Contrast  Result Date: 12/12/2019 CLINICAL DATA:  58ighty-five year female with altered mental status. EXAM: CT HEAD WITHOUT CONTRAST TECHNIQUE: Contiguous axial images were obtained from the base of the skull through the vertex without intravenous contrast. COMPARISON:  None. FINDINGS: Brain: There is mild age-related atrophy and moderate chronic microvascular ischemic changes. There is no acute intracranial hemorrhage. No mass effect or midline shift. No extra-axial fluid collection. Vascular: No hyperdense vessel or unexpected calcification. Skull: Normal. Negative for fracture or focal lesion. Sinuses/Orbits: No acute finding. Other: None IMPRESSION: 1. No acute intracranial hemorrhage. 2. Age-related atrophy and chronic microvascular ischemic changes. Electronically Signed   By: Elgie CollardArash  Radparvar M.D.   On: 12/12/2019 17:40   MR BRAIN WO CONTRAST  Result Date: 12/12/2019 CLINICAL DATA:  Initial evaluation for acute ataxia, stroke suspected. EXAM: MRI HEAD WITHOUT CONTRAST TECHNIQUE: Multiplanar, multiecho pulse sequences of the brain and surrounding structures were obtained without intravenous contrast. COMPARISON:  Prior CT  from earlier same day. FINDINGS: Brain: Examination technically limited as the patient was unable to tolerate the full length of the exam. Additionally, images provided are markedly degraded by motion. Generalized age-related cerebral atrophy. Patchy and confluent T2/FLAIR hyperintensity within the periventricular deep white matter most consistent with chronic small vessel ischemic disease, moderate nature. Multiple scattered superimposed remote lacunar infarcts present within the bilateral basal ganglia. Patchy small vessel changes noted within the pons as well. No abnormal foci of restricted diffusion to suggest acute or subacute ischemia. Gray-white matter differentiation maintained. No areas of remote cortical infarction. No definite evidence for acute intracranial hemorrhage. No mass lesion or midline shift. No hydrocephalus. No extra-axial fluid collection. Pituitary gland grossly normal. Midline structures intact. Vascular: Major intracranial vascular flow voids are grossly maintained at the skull base. Skull and upper cervical spine: Craniocervical junction within normal limits.  Bone marrow signal intensity grossly normal. No scalp soft tissue abnormality. Sinuses/Orbits: Patient status post bilateral ocular lens replacement. Paranasal sinuses are grossly clear. No mastoid effusion. Other: None. IMPRESSION: 1. Technically limited exam due to motion artifact and the patient's inability to tolerate the full length of the study. 2. No acute intracranial infarct or other definite abnormality. 3. Age-related cerebral atrophy with moderate chronic microvascular ischemic disease, with multiple superimposed remote lacunar infarcts within the bilateral basal ganglia. Electronically Signed   By: Rise Mu M.D.   On: 12/12/2019 22:56   DG Chest Portable 1 View  Result Date: 12/12/2019 CLINICAL DATA:  Weakness and cough. EXAM: PORTABLE CHEST 1 VIEW COMPARISON:  None. FINDINGS: Cardiomediastinal silhouette  is normal. Mediastinal contours appear intact. Calcific atherosclerotic disease of the aorta. Nodular opacities in the right upper and lower lung fields may represent pulmonary nodules, pleural scarring or focal airspace consolidation. Osseous structures are without acute abnormality. Soft tissues are grossly normal. IMPRESSION: 1. Nodular opacities in the right upper and lower lung fields may represent pulmonary nodules, pleural scarring or focal airspace consolidation. 2. Calcific atherosclerotic disease of the aorta. Electronically Signed   By: Ted Mcalpine M.D.   On: 12/12/2019 17:43   VAS US CAROTID  Result Date: 12/13/2019 Carotid Arterial Duplex Study Indications:       Covid-19, hypertensive emergency, dizziness, gait                    disturbance. Risk Factors:      Hyperlipidemia. Comparison Study:  No prior study Performing Technologist: Sherren Kerns RVS  Examination Guidelines: A complete evaluation includes B-mode imaging, spectral Doppler, color Doppler, and power Doppler as needed of all accessible portions of each vessel. Bilateral testing is considered an integral part of a complete examination. Limited examinations for reoccurring indications may be performed as noted.  Right Carotid Findings: +----------+--------+--------+--------+------------------+--------+           PSV cm/sEDV cm/sStenosisPlaque DescriptionComments +----------+--------+--------+--------+------------------+--------+ CCA Prox  52      8               homogeneous                +----------+--------+--------+--------+------------------+--------+ CCA Distal42      9               heterogenous               +----------+--------+--------+--------+------------------+--------+ ICA Prox  130     34      1-39%   calcific                   +----------+--------+--------+--------+------------------+--------+ ICA Mid   88      18                                          +----------+--------+--------+--------+------------------+--------+ ICA Distal59      20                                         +----------+--------+--------+--------+------------------+--------+ ECA       139     13                                         +----------+--------+--------+--------+------------------+--------+ +----------+--------+-------+--------+-------------------+  PSV cm/sEDV cmsDescribeArm Pressure (mmHG) +----------+--------+-------+--------+-------------------+ [id number]                                         +----------+--------+-------+--------+-------------------+ +---------+--------+--+--------+--+ VertebralPSV cm/s42EDV cm/s10 +---------+--------+--+--------+--+  Left Carotid Findings: +----------+--------+--------+--------+------------------+------------------+           PSV cm/sEDV cm/sStenosisPlaque DescriptionComments           +----------+--------+--------+--------+------------------+------------------+ CCA Prox  44      11                                intimal thickening +----------+--------+--------+--------+------------------+------------------+ CCA Distal48      12              heterogenous                         +----------+--------+--------+--------+------------------+------------------+ ICA Prox  59      14      1-39%   heterogenous                         +----------+--------+--------+--------+------------------+------------------+ ICA Distal72      22                                                   +----------+--------+--------+--------+------------------+------------------+ ECA       64      10                                                   +----------+--------+--------+--------+------------------+------------------+ +----------+--------+--------+--------+-------------------+           PSV cm/sEDV cm/sDescribeArm Pressure (mmHG)  +----------+--------+--------+--------+-------------------+ [id number]                                          +----------+--------+--------+--------+-------------------+ +---------+--------+--+--------+--+ VertebralPSV cm/s56EDV cm/s15 +---------+--------+--+--------+--+  Summary: Right Carotid: Velocities in the right ICA are consistent with a 1-39% stenosis. Left Carotid: Velocities in the left ICA are consistent with a 1-39% stenosis. Vertebrals:  Bilateral vertebral arteries demonstrate antegrade flow. Subclavians: Normal flow hemodynamics were seen in bilateral subclavian              arteries. *See table(s) above for measurements and observations.  Electronically signed by Sherald Hess MD on 12/13/2019 at 3:56:38 PM.    Final    ECHOCARDIOGRAM LIMITED  Result Date: 12/13/2019   ECHOCARDIOGRAM LIMITED REPORT   Patient Name:   MACLAINE AHOLA Date of Exam: 12/13/2019 Medical Rec #:  [id number]    Height:       63.0 in Accession #:    [id number]   Weight:       97.7 lb Date of Birth:  05/07/1932    BSA:          1.43 m Patient Age:    87 years     BP:           181/77 mmHg Patient Gender: F  HR:           66 bpm. Exam Location:  Inpatient  Procedure: Limited Echo, Saline Contrast Bubble Study, Limited Color Doppler and            Cardiac Doppler Indications:    TIA 435.9  History:        Patient has no prior history of Echocardiogram examinations.  Sonographer:    Johny Chess Referring Phys: [id number] [place]  1. Left ventricular ejection fraction, by visual estimation, is 55 to 60%. The left ventricle has normal function. There is no increased left ventricular wall thickness.  2. Left ventricular diastolic parameters are consistent with Grade I diastolic dysfunction (impaired relaxation).  3. The left ventricle has no regional wall motion abnormalities.  4. Global right ventricle has normal systolc function.The right ventricular size is normal. no increase in  right ventricular wall thickness.  5. Mild mitral valve prolapse.  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 Mild mitral valve regurgitation.  7. Tricuspid valve regurgitation mild-moderate.  8. Tricuspid valve regurgitation mild-moderate.  9. Aortic valve regurgitation is mild to moderate. 10. Aortic dilatation noted. 11. There is mild dilatation of the aortic root measuring 41 mm. 12. Mildly elevated pulmonary artery systolic pressure. 13. The inferior vena cava is normal in size with greater than 50% respiratory variability, suggesting right atrial pressure of 3 mmHg. FINDINGS  Left Ventricle: Left ventricular ejection fraction, by visual estimation, is 55 to 60%. The left ventricle has normal function. The left ventricle has no regional wall motion abnormalities. The left ventricular internal cavity size was the left ventricle is normal in size. There is no increased left ventricular wall thickness. Left ventricular diastolic parameters are consistent with Grade I diastolic dysfunction (impaired relaxation). Normal left atrial pressure. Right Ventricle: The right ventricular size is normal. No increase in right ventricular wall thickness. Global RV systolic function is has normal systolic function. The tricuspid regurgitant velocity is 2.76 m/s, and with an assumed right atrial pressure  of 3 mmHg, the estimated right ventricular systolic pressure is mildly elevated at 33.5 mmHg. Left Atrium: Left atrial size was normal in size. Right Atrium: Right atrial size was normal in size. Right atrial pressure is estimated at 3 mmHg. Mitral Valve: The mitral valve is myxomatous. There is mild late systolic prolapse of of the mitral valve. There is mild thickening of the mitral valve leaflet(s). MV Area by PHT, 2.09 cm. MV PHT, 105.27 msec. Mild mitral valve regurgitation. Tricuspid Valve: Tricuspid valve regurgitation mild-moderate. Aortic Valve: The aortic valve is tricuspid. Aortic valve regurgitation is mild to moderate.  Aortic regurgitation PHT measures 500 msec. Pulmonic Valve: The pulmonic valve was grossly normal. Pulmonic valve regurgitation is trivial by color flow Doppler. Pulmonic regurgitation is trivial by color flow Doppler. Aorta: Aortic dilatation noted. There is mild dilatation of the aortic root measuring 41 mm. Venous: The inferior vena cava is normal in size with greater than 50% respiratory variability, suggesting right atrial pressure of 3 mmHg. Shunts: Agitated saline contrast was given intravenously to evaluate for intracardiac shunting. Saline contrast bubble study was negative, with no evidence of any interatrial shunt. No atrial level shunt detected by color flow Doppler.  LEFT VENTRICLE         Normals PLAX 2D LVIDd:         4.10 cm         Diastology  Normals LVIDs:         2.90 cm         LV e' lateral:   8.59 cm/s LV PW:         0.80 cm         LV E/e' lateral: 9.1 LV IVS:        0.80 cm         LV e' medial:    5.66 cm/s LV SV:         42 ml           LV E/e' medial:  13.9 LV SV Index:   30.15  LEFT ATRIUM         Index LA diam:    2.10 cm  AORTIC VALVE             Normals LVOT Vmax:   88.10 cm/s LVOT Vmean:  58.700 cm/s LVOT VTI:    0.190 m AI PHT:      500 msec  AORTA                 Normals Ao Root diam: 4.10 cm Ao Asc diam:  3.50 cm MITRAL VALVE               Normals TRICUSPID VALVE             Normals MV Area (PHT): 2.09 cm            TR Peak grad:   30.5 mmHg MV PHT:        105.27 msec         TR Vmax:        276.00 cm/s MV Decel Time: 363 msec MV E velocity: 78.50 cm/s          SHUNTS                                    Systemic VTI: 0.19 m  Rachelle HoraMihai Croitoru MD Electronically signed by Thurmon FairMihai Croitoru MD Signature Date/Time: 12/13/2019/12:05:16 PM    Final       The results of significant diagnostics from this hospitalization (including imaging, microbiology, ancillary and laboratory) are listed below for reference.     Microbiology: Recent Results (from the past 240 hour(s))   Urine culture     Status: None   Collection Time: 12/12/19  6:42 PM   Specimen: Urine, Random  Result Value Ref Range Status   Specimen Description   Final    URINE, RANDOM Performed at [facility], [address] [place], [place] [address]    Special Requests   Final    NONE Performed at [facility], [address] [place], [place] [address]    Culture   Final    NO GROWTH Performed at [facility], [address] [place], [place] [address]    Report Status 12/14/2019 FINAL  Final     Labs: BNP (last 3 results) No results for input(s): BNP in the last 8760 hours. Basic Metabolic Panel: Recent Labs  Lab 12/12/19 1751 12/12/19 1751 12/13/19 0330 12/14/19 0232 12/15/19 0214 12/16/19 0150 12/17/19 0220  NA 134*  --   --  135 134* 134* 135  K 3.9  --   --  4.1 3.7 3.7 3.9  CL 98  --   --  102 103 103 103  CO2 24  --   --  24 22 22 22   GLUCOSE 91  --   --  95 102* 102* 95  BUN 22  --   --  27* 26* 29* 28*  CREATININE 1.03*   < > 0.92 1.00 0.96 0.85 0.88  CALCIUM 9.2  --   --  9.0 8.9 8.9 9.1   < > = values in this interval not displayed.   Liver Function Tests: Recent Labs  Lab 12/12/19 1751 12/14/19 0232 12/15/19 0214 12/16/19 0150 12/17/19 0220  AST 26 23 23 23 21   ALT 16 15 13 15 18   ALKPHOS 87 77 74 69 75  BILITOT 0.8 0.5 0.4 0.6 0.4  PROT 7.9 6.9 6.4* 6.3* 5.9*  ALBUMIN 4.3 3.7 3.6 3.4* 3.2*   No results for input(s): LIPASE, AMYLASE in the last 168 hours. No results for input(s): AMMONIA in the last 168 hours. CBC: Recent Labs  Lab 12/12/19 1751 12/12/19 1751 12/13/19 0330 12/14/19 0232 12/15/19 0214 12/16/19 0150 12/17/19 0220  WBC 9.0   < > 7.4 6.7 10.2 10.2 11.3*  NEUTROABS 6.2  --   --  4.6 7.1 6.9 7.9*  HGB 12.6   < > 12.6 12.4 11.9* 12.1 11.4*  HCT 38.5   < > 38.4 37.2 36.6 37.3 35.7*  MCV 90.2   < > 89.7 89.6 89.5 91.9 90.8  PLT 267   < > 278 280 321 318 313   < > = values in this  interval not displayed.   Cardiac Enzymes: No results for input(s): CKTOTAL, CKMB, CKMBINDEX, TROPONINI in the last 168 hours. BNP: Invalid input(s): POCBNP CBG: No results for input(s): GLUCAP in the last 168 hours. D-Dimer Recent Labs    12/16/19 0150 12/17/19 0220  DDIMER 0.44 0.51*   Hgb A1c No results for input(s): HGBA1C in the last 72 hours. Lipid Profile No results for input(s): CHOL, HDL, LDLCALC, TRIG, CHOLHDL, LDLDIRECT in the last 72 hours. Thyroid function studies No results for input(s): TSH, T4TOTAL, T3FREE, THYROIDAB in the last 72 hours.  Invalid input(s): FREET3 Anemia work up No results for input(s): VITAMINB12, FOLATE, FERRITIN, TIBC, IRON, RETICCTPCT in the last 72 hours. Urinalysis    Component Value Date/Time   COLORURINE YELLOW 12/12/2019 1842   APPEARANCEUR CLEAR 12/12/2019 1842   LABSPEC 1.012 12/12/2019 1842   PHURINE 6.0 12/12/2019 1842   GLUCOSEU NEGATIVE 12/12/2019 1842   HGBUR NEGATIVE 12/12/2019 1842   BILIRUBINUR NEGATIVE 12/12/2019 1842   KETONESUR NEGATIVE 12/12/2019 1842   PROTEINUR NEGATIVE 12/12/2019 1842   NITRITE NEGATIVE 12/12/2019 1842   LEUKOCYTESUR NEGATIVE 12/12/2019 1842   Sepsis Labs Invalid input(s): PROCALCITONIN,  WBC,  LACTICIDVEN Microbiology Recent Results (from the past 240 hour(s))  Urine culture     Status: None   Collection Time: 12/12/19  6:42 PM   Specimen: Urine, Random  Result Value Ref Range Status   Specimen Description   Final    URINE, RANDOM Performed at [facility], [address] [place], [place] [address]    Special Requests   Final    NONE Performed at [facility], [address], [place], [place] [address]    Culture   Final    NO GROWTH Performed at [facility], [address], [place], [place] [address]    Report Status 12/14/2019 FINAL  Final     Time coordinating discharge in minutes: 55  SIGNED:   Calvert Cantor, MD  [facility] 12/17/2019, 9:20 AM Pager  If 7PM-7AM, please contact night-coverage [email or web address] Password [email or web address]

## 2019-12-31 ENCOUNTER — Emergency Department (HOSPITAL_COMMUNITY): Payer: Medicare HMO

## 2019-12-31 ENCOUNTER — Encounter (HOSPITAL_COMMUNITY): Payer: Self-pay

## 2019-12-31 ENCOUNTER — Other Ambulatory Visit: Payer: Self-pay

## 2019-12-31 ENCOUNTER — Inpatient Hospital Stay (HOSPITAL_COMMUNITY)
Admission: EM | Admit: 2019-12-31 | Discharge: 2020-01-04 | DRG: 683 | Disposition: A | Discharge status: Home Health | Payer: Medicare HMO | Attending: Internal Medicine | Admitting: Internal Medicine

## 2019-12-31 DIAGNOSIS — Z8619 Personal history of other infectious and parasitic diseases: Secondary | ICD-10-CM | POA: Diagnosis not present

## 2019-12-31 DIAGNOSIS — W1811XA Fall from or off toilet without subsequent striking against object, initial encounter: Secondary | ICD-10-CM | POA: Diagnosis not present

## 2019-12-31 DIAGNOSIS — N390 Urinary tract infection, site not specified: Secondary | ICD-10-CM | POA: Diagnosis present

## 2019-12-31 DIAGNOSIS — I110 Hypertensive heart disease with heart failure: Secondary | ICD-10-CM | POA: Diagnosis present

## 2019-12-31 DIAGNOSIS — Z79890 Hormone replacement therapy (postmenopausal): Secondary | ICD-10-CM

## 2019-12-31 DIAGNOSIS — Z79899 Other long term (current) drug therapy: Secondary | ICD-10-CM

## 2019-12-31 DIAGNOSIS — Y92002 Bathroom of unspecified non-institutional (private) residence single-family (private) house as the place of occurrence of the external cause: Secondary | ICD-10-CM

## 2019-12-31 DIAGNOSIS — F0280 Dementia in other diseases classified elsewhere without behavioral disturbance: Secondary | ICD-10-CM | POA: Diagnosis not present

## 2019-12-31 DIAGNOSIS — Z66 Do not resuscitate: Secondary | ICD-10-CM | POA: Diagnosis not present

## 2019-12-31 DIAGNOSIS — N3000 Acute cystitis without hematuria: Secondary | ICD-10-CM | POA: Diagnosis not present

## 2019-12-31 DIAGNOSIS — I5032 Chronic diastolic (congestive) heart failure: Secondary | ICD-10-CM | POA: Diagnosis not present

## 2019-12-31 DIAGNOSIS — B957 Other staphylococcus as the cause of diseases classified elsewhere: Secondary | ICD-10-CM | POA: Diagnosis present

## 2019-12-31 DIAGNOSIS — G909 Disorder of the autonomic nervous system, unspecified: Secondary | ICD-10-CM | POA: Diagnosis not present

## 2019-12-31 DIAGNOSIS — Z681 Body mass index (BMI) 19 or less, adult: Secondary | ICD-10-CM

## 2019-12-31 DIAGNOSIS — Z23 Encounter for immunization: Secondary | ICD-10-CM | POA: Diagnosis not present

## 2019-12-31 DIAGNOSIS — F329 Major depressive disorder, single episode, unspecified: Secondary | ICD-10-CM | POA: Diagnosis not present

## 2019-12-31 DIAGNOSIS — R55 Syncope and collapse: Secondary | ICD-10-CM | POA: Diagnosis present

## 2019-12-31 DIAGNOSIS — E860 Dehydration: Secondary | ICD-10-CM | POA: Diagnosis present

## 2019-12-31 DIAGNOSIS — G309 Alzheimer's disease, unspecified: Secondary | ICD-10-CM

## 2019-12-31 DIAGNOSIS — N179 Acute kidney failure, unspecified: Principal | ICD-10-CM

## 2019-12-31 DIAGNOSIS — E039 Hypothyroidism, unspecified: Secondary | ICD-10-CM | POA: Diagnosis present

## 2019-12-31 DIAGNOSIS — D72829 Elevated white blood cell count, unspecified: Secondary | ICD-10-CM | POA: Diagnosis not present

## 2019-12-31 DIAGNOSIS — I10 Essential (primary) hypertension: Secondary | ICD-10-CM | POA: Diagnosis present

## 2019-12-31 DIAGNOSIS — R627 Adult failure to thrive: Secondary | ICD-10-CM | POA: Diagnosis not present

## 2019-12-31 HISTORY — DX: Hypothyroidism, unspecified: E03.9

## 2019-12-31 HISTORY — DX: Dementia in other diseases classified elsewhere, unspecified severity, without behavioral disturbance, psychotic disturbance, mood disturbance, and anxiety: F02.80

## 2019-12-31 HISTORY — DX: Chronic diastolic (congestive) heart failure: I50.32

## 2019-12-31 HISTORY — DX: Essential (primary) hypertension: I10

## 2019-12-31 LAB — URINALYSIS, ROUTINE W REFLEX MICROSCOPIC
Bacteria, UA: NONE SEEN
Bilirubin Urine: NEGATIVE
Glucose, UA: NEGATIVE mg/dL
Hgb urine dipstick: NEGATIVE
Ketones, ur: NEGATIVE mg/dL
Nitrite: NEGATIVE
Protein, ur: NEGATIVE mg/dL
Specific Gravity, Urine: 1.01 (ref 1.005–1.030)
pH: 7 (ref 5.0–8.0)

## 2019-12-31 LAB — CBG MONITORING, ED: Glucose-Capillary: 130 mg/dL — ABNORMAL HIGH (ref 70–99)

## 2019-12-31 LAB — CBC WITH DIFFERENTIAL/PLATELET
Abs Immature Granulocytes: 0.1 10*3/uL — ABNORMAL HIGH (ref 0.00–0.07)
Basophils Absolute: 0 10*3/uL (ref 0.0–0.1)
Basophils Relative: 0 %
Eosinophils Absolute: 0 10*3/uL (ref 0.0–0.5)
Eosinophils Relative: 0 %
HCT: 35.2 % — ABNORMAL LOW (ref 36.0–46.0)
Hemoglobin: 11.4 g/dL — ABNORMAL LOW (ref 12.0–15.0)
Immature Granulocytes: 1 %
Lymphocytes Relative: 4 %
Lymphs Abs: 0.7 10*3/uL (ref 0.7–4.0)
MCH: 30.2 pg (ref 26.0–34.0)
MCHC: 32.4 g/dL (ref 30.0–36.0)
MCV: 93.4 fL (ref 80.0–100.0)
Monocytes Absolute: 1.1 10*3/uL — ABNORMAL HIGH (ref 0.1–1.0)
Monocytes Relative: 6 %
Neutro Abs: 15.6 10*3/uL — ABNORMAL HIGH (ref 1.7–7.7)
Neutrophils Relative %: 89 %
Platelets: 298 10*3/uL (ref 150–400)
RBC: 3.77 MIL/uL — ABNORMAL LOW (ref 3.87–5.11)
RDW: 13.2 % (ref 11.5–15.5)
WBC: 17.6 10*3/uL — ABNORMAL HIGH (ref 4.0–10.5)
nRBC: 0 % (ref 0.0–0.2)

## 2019-12-31 LAB — COMPREHENSIVE METABOLIC PANEL
ALT: 20 U/L (ref 0–44)
AST: 19 U/L (ref 15–41)
Albumin: 3.4 g/dL — ABNORMAL LOW (ref 3.5–5.0)
Alkaline Phosphatase: 93 U/L (ref 38–126)
Anion gap: 8 (ref 5–15)
BUN: 24 mg/dL — ABNORMAL HIGH (ref 8–23)
CO2: 27 mmol/L (ref 22–32)
Calcium: 9.3 mg/dL (ref 8.9–10.3)
Chloride: 99 mmol/L (ref 98–111)
Creatinine, Ser: 1.04 mg/dL — ABNORMAL HIGH (ref 0.44–1.00)
GFR calc Af Amer: 57 mL/min — ABNORMAL LOW (ref >60)
GFR calc non Af Amer: 49 mL/min — ABNORMAL LOW (ref >60)
Glucose, Bld: 118 mg/dL — ABNORMAL HIGH (ref 70–99)
Potassium: 4.5 mmol/L (ref 3.5–5.1)
Sodium: 134 mmol/L — ABNORMAL LOW (ref 135–145)
Total Bilirubin: 0.5 mg/dL (ref 0.3–1.2)
Total Protein: 7.3 g/dL (ref 6.5–8.1)

## 2019-12-31 LAB — LACTIC ACID, PLASMA
Lactic Acid, Venous: 2.1 mmol/L (ref 0.5–1.9)
Lactic Acid, Venous: 1.4 mmol/L (ref 0.5–1.9)

## 2019-12-31 LAB — TROPONIN I (HIGH SENSITIVITY)
Troponin I (High Sensitivity): 4 ng/L (ref <18)
Troponin I (High Sensitivity): 3 ng/L

## 2019-12-31 LAB — MAGNESIUM: Magnesium: 2.1 mg/dL (ref 1.7–2.4)

## 2019-12-31 LAB — BRAIN NATRIURETIC PEPTIDE: B Natriuretic Peptide: 110.1 pg/mL — ABNORMAL HIGH (ref 0.0–100.0)

## 2019-12-31 MED ORDER — ACETAMINOPHEN 325 MG PO TABS
650.0000 mg | ORAL_TABLET | Freq: Four times a day (QID) | ORAL | Status: DC | PRN
  Administered 2020-01-02 – 2020-01-03 (×2): 650 mg via ORAL
  Filled 2019-12-31 (×2): qty 2

## 2019-12-31 MED ORDER — GABAPENTIN 300 MG PO CAPS
300.0000 mg | ORAL_CAPSULE | Freq: Three times a day (TID) | ORAL | Status: DC
  Administered 2020-01-01 – 2020-01-04 (×10): 300 mg via ORAL
  Filled 2019-12-31 (×10): qty 1

## 2019-12-31 MED ORDER — ACETAMINOPHEN 650 MG RE SUPP: 650.0000 mg | Freq: Four times a day (QID) | RECTAL | Status: DC | PRN

## 2019-12-31 MED ORDER — SODIUM CHLORIDE 0.9 % IV BOLUS
1000.0000 mL | Freq: Once | INTRAVENOUS | Status: AC
  Administered 2019-12-31: 1000 mL via INTRAVENOUS

## 2019-12-31 MED ORDER — SODIUM CHLORIDE 0.9% FLUSH
3.0000 mL | Freq: Two times a day (BID) | INTRAVENOUS | Status: DC
  Administered 2020-01-01 – 2020-01-04 (×7): 3 mL via INTRAVENOUS

## 2019-12-31 MED ORDER — SERTRALINE HCL 50 MG PO TABS
50.0000 mg | ORAL_TABLET | Freq: Every day | ORAL | Status: DC
  Administered 2020-01-01 – 2020-01-04 (×4): 50 mg via ORAL
  Filled 2019-12-31 (×4): qty 1

## 2019-12-31 MED ORDER — ADULT MULTIVITAMIN W/MINERALS CH
1.0000 | ORAL_TABLET | Freq: Every day | ORAL | Status: DC
  Administered 2020-01-01 – 2020-01-04 (×4): 1 via ORAL
  Filled 2019-12-31 (×4): qty 1

## 2019-12-31 MED ORDER — SODIUM CHLORIDE 0.9 % IV SOLN
1.0000 g | INTRAVENOUS | Status: DC
  Administered 2019-12-31 – 2020-01-02 (×3): 1 g via INTRAVENOUS
  Filled 2019-12-31: qty 10
  Filled 2019-12-31 (×2): qty 1
  Filled 2019-12-31: qty 10

## 2019-12-31 MED ORDER — MEMANTINE HCL 5 MG PO TABS
5.0000 mg | ORAL_TABLET | Freq: Two times a day (BID) | ORAL | Status: DC
  Administered 2020-01-01 – 2020-01-04 (×7): 5 mg via ORAL
  Filled 2019-12-31 (×10): qty 1

## 2019-12-31 MED ORDER — LEVOTHYROXINE SODIUM 75 MCG PO TABS
75.0000 ug | ORAL_TABLET | Freq: Every day | ORAL | Status: DC
  Administered 2020-01-01 – 2020-01-04 (×4): 75 ug via ORAL
  Filled 2019-12-31 (×4): qty 1

## 2019-12-31 MED ORDER — DONEPEZIL HCL 10 MG PO TABS
10.0000 mg | ORAL_TABLET | Freq: Every day | ORAL | Status: DC
  Administered 2020-01-01 – 2020-01-03 (×3): 10 mg via ORAL
  Filled 2019-12-31 (×3): qty 1

## 2019-12-31 MED ORDER — ENSURE ENLIVE PO LIQD
237.0000 mL | Freq: Two times a day (BID) | ORAL | Status: DC
  Administered 2020-01-01: 237 mL via ORAL
  Filled 2019-12-31 (×2): qty 237

## 2019-12-31 NOTE — H&P (Signed)
History and Physical    PLEASE NOTE THAT DRAGON DICTATION SOFTWARE WAS USED IN THE CONSTRUCTION OF THIS NOTE.   Amanda Sanford ZOX:[id number] DOB: 1932/10/09 DOA: 12/31/2019  PCP: Ward, Alonna Minium, PA-C Patient coming from: home (lives with her family)  I have personally briefly reviewed patient's old medical records in [place]  Chief Complaint: syncope  HPI: Amanda Sanford is a 86 y.o. female with medical history significant for advanced Alzheimer's dementia, chronic diastolic heart failure, acquired hypothyroidism, hypertension, and recent COVID-19 infection, who is admitted to [place] on 12/31/2019 for further evaluation and management of syncope after presenting from home to [facility] Emergency Department complaining of such.   In the context of patient's advanced dementia, the following history is obtained via discussions with the patient's family, with whom she lives, as well as my discussions with the emergency department physician and via chart review.  The patient was recently hospitalized at [facility] for COVID-19 pneumonia from 12/12/2019 to 12/16/2018, with COVID-19 PCR found to be positive on 12/12/2019.  Per review of discharge summary, at the time of discharge, prior outpatient antihypertensive regimen of Norvasc 5 mg p.o. daily and lisinopril 5 mg p.o. daily was resumed.  Patient's family reports that while patient's cough has continued to improve following discharge from the hospital on 12/16/2018, that the patient has also exhibited diminished oral intake over the last several days.  Therefore this is not been associated with any known nausea, vomiting, or diarrhea.   Earlier today, patient still reports that they had assisted the patient to the bathroom, and upon her her attempt to rise from the associated seated to standing position, that the patient lost consciousness, and fell forward to the bathroom floor.  This event was reportedly  witnessed by the patient's family, and they do not believe that the patient hit her head as a component of this fall.  She regained consciousness within a few seconds of the above.  However, upon attempting to stand up from the bathroom floor, the patient reported that she developed a sensation that she may lose consciousness, although formal syncope did not recur.  They deny any associated generalized tonic-clonic activity, nor any associated loss of bowel/bladder function or tongue biting.  In the setting of a syncopal episode followed by an episode of presyncope, EMS was contacted by the patient's family, and patient was brought to [facility] emergency department for further evaluation of such.  Of note, the patient is not on any blood thinners at home.    ED Course:  Vital signs in the ED were notable for the following: Temperature max 97.4; heart rate 59-78, blood pressure ranged from 105/58-156/65; respiratory rate 14-21, and oxygen saturation 97-100% on room air.   Labs were notable for the following: CMP notable for sodium 134, potassium 4.5, BUN 24 compared to most recent prior value of 28 on 12/17/2019, creatinine 1.04 relative to most recent prior value of 0.88 on 12/17/2019, BUN to creatinine ratio 23.1, glucose 118, and liver enzymes were found to be within normal limits.  High-sensitivity troponin I x2 was treated initial value of 4, followed by subsequent value of 3.  Urinalysis showed a hazy specimen with 21-50 white blood cells, and small leukocyte esterace.  CBC notable for white blood cell count of 17.6 with 89% neutrophils, which was relative to most recent prior white blood cell count of 11,300 on 12/17/2019, hemoglobin 11.4 unchanged from prior value checked on 12/17/2019.  Lactic acid  2.1.   Blood cultures x2 were collected prior to initiation of any antibiotics.  Presenting EKG showed sinus rhythm with heart rate 62, and unchanged nonspecific T wave inversion in aVR and V1 relative to  EKG from 12/12/2019, and no evidence of interval ST changes.  Chest x-ray showed no evidence of acute cardiopulmonary process, including no evidence of infiltrate, edema, effusion, or pneumothorax.  Noncontrast CT of the head showed no evidence of acute intracranial pathology, including no evidence of intracranial hemorrhage.  While in the ED, the following were administered: 1 L of IV normal saline.   Subsequently, the patient was admitted to [place] for additional work-up and management of presenting syncope in the context of recent COVID-19 infection, with COVID-19 PCR positive finding on 12/12/2019.      Review of Systems: As per HPI otherwise 10 point review of systems negative.   Past Medical History:  Diagnosis Date  . Alzheimer's disease ([place])   . Benign essential HTN   . Chronic diastolic heart failure ([place])   . Hypothyroid     History reviewed. No pertinent surgical history.  Social History:  reports that she has never smoked. She has never used smokeless tobacco. She reports that she does not drink alcohol or use drugs.   No Known Allergies  Family History: Family history reviewed and not pertinent    Prior to Admission medications   Medication Sig Start Date End Date Taking? Authorizing Provider  amLODipine (NORVASC) 5 MG tablet Take 1 tablet (5 mg total) by mouth daily. 12/17/19   Debbe Odea, MD  ascorbic acid (VITAMIN C) 500 MG tablet Take 1 tablet (500 mg total) by mouth daily. 12/17/19   Debbe Odea, MD  cyanocobalamin (,VITAMIN B-12,) 1000 MCG/ML injection Inject 1 mL into the muscle every 7 (seven) days. 12/02/19   [provider]  feeding supplement, ENSURE ENLIVE, (ENSURE ENLIVE) LIQD Take 237 mLs by mouth 2 (two) times daily between meals. 12/17/19   Debbe Odea, MD  folic acid (FOLVITE) 1 MG tablet Take 1 tablet (1 mg total) by mouth daily. 12/17/19   Debbe Odea, MD  levothyroxine (SYNTHROID) 75 MCG tablet Take 75 mcg by mouth daily.  11/30/19   [provider]  lisinopril (ZESTRIL) 5 MG tablet Take 1 tablet (5 mg total) by mouth daily. 12/17/19   Debbe Odea, MD  memantine (NAMENDA) 5 MG tablet Take 5 mg by mouth 2 (two) times daily. AM and PM    [provider]  Multiple Vitamin (MULTIVITAMIN WITH MINERALS) TABS tablet Take 1 tablet by mouth daily. 12/17/19   Debbe Odea, MD  sertraline (ZOLOFT) 50 MG tablet Take 50 mg by mouth daily. 11/30/19   [provider]  zinc sulfate 220 (50 Zn) MG capsule Take 1 capsule (220 mg total) by mouth daily. 12/17/19   Debbe Odea, MD     Objective    Physical Exam: Vitals:   12/31/19 1750 12/31/19 1930  BP: 131/63 (!) 156/65  Pulse: 61 63  Resp: 17 14  Temp: (!) 97.4 F (36.3 C)   TempSrc: Oral   SpO2: 97% 100%    General: appears to be stated age; somewhat somnolent; will open eyes to verbal stimuli before falling back asleep; unable to follow instructions.  Skin: warm, dry, no rash Head:  AT/[place] Eyes:  PEARL b/l, EOMI Mouth:  Oral mucosa membranes appear dry, normal dentition Neck: supple; trachea midline Heart:  RRR; did not appreciate any M/R/G Lungs: CTAB, did not  appreciate any wheezes, rales, or rhonchi Abdomen: + BS; soft, ND, NT Vascular: 2+ pedal pulses b/l; 2+ radial pulses b/l Extremities: no peripheral edema, no muscle wasting Neuro: In the setting of patient's Alzheimer's dementia and associated limited ability to follow instructions, full neurologic assessment at this time, including unable to perform full assessment of strength, sensation, and cranial nerves.  However, patient was noted to spontaneously move all 4 extremities.   Labs on Admission: I have personally reviewed following labs and imaging studies  CBC: Recent Labs  Lab 12/31/19 1851  WBC 17.6*  NEUTROABS 15.6*  HGB 11.4*  HCT 35.2*  MCV 93.4  PLT 782   Basic Metabolic Panel: Recent Labs  Lab 12/31/19 1851  NA 134*  K 4.5  CL 99  CO2 27  GLUCOSE 118*   BUN 24*  CREATININE 1.04*  CALCIUM 9.3   GFR: CrCl cannot be calculated (Unknown ideal weight.). Liver Function Tests: Recent Labs  Lab 12/31/19 1851  AST 19  ALT 20  ALKPHOS 93  BILITOT 0.5  PROT 7.3  ALBUMIN 3.4*   No results for input(s): LIPASE, AMYLASE in the last 168 hours. No results for input(s): AMMONIA in the last 168 hours. Coagulation Profile: No results for input(s): INR, PROTIME in the last 168 hours. Cardiac Enzymes: No results for input(s): CKTOTAL, CKMB, CKMBINDEX, TROPONINI in the last 168 hours. BNP (last 3 results) No results for input(s): PROBNP in the last 8760 hours. HbA1C: No results for input(s): HGBA1C in the last 72 hours. CBG: Recent Labs  Lab 12/31/19 1806  GLUCAP 130*   Lipid Profile: No results for input(s): CHOL, HDL, LDLCALC, TRIG, CHOLHDL, LDLDIRECT in the last 72 hours. Thyroid Function Tests: No results for input(s): TSH, T4TOTAL, FREET4, T3FREE, THYROIDAB in the last 72 hours. Anemia Panel: No results for input(s): VITAMINB12, FOLATE, FERRITIN, TIBC, IRON, RETICCTPCT in the last 72 hours. Urine analysis:    Component Value Date/Time   COLORURINE YELLOW 12/31/2019 1931   APPEARANCEUR HAZY (A) 12/31/2019 1931   LABSPEC 1.010 12/31/2019 1931   PHURINE 7.0 12/31/2019 1931   GLUCOSEU NEGATIVE 12/31/2019 1931   HGBUR NEGATIVE 12/31/2019 1931   BILIRUBINUR NEGATIVE 12/31/2019 1931   KETONESUR NEGATIVE 12/31/2019 1931   PROTEINUR NEGATIVE 12/31/2019 1931   NITRITE NEGATIVE 12/31/2019 1931   LEUKOCYTESUR SMALL (A) 12/31/2019 1931    Radiological Exams on Admission: CT Head Wo Contrast  Result Date: 12/31/2019 CLINICAL DATA:  Recent syncopal episode EXAM: CT HEAD WITHOUT CONTRAST TECHNIQUE: Contiguous axial images were obtained from the base of the skull through the vertex without intravenous contrast. COMPARISON:  12/12/2019 FINDINGS: Brain: Mild atrophic changes and chronic white matter ischemic changes are seen. These are  similar to that noted on the prior CT examination. Multiple prior lacunar infarcts are seen and stable. No findings to suggest acute hemorrhage, acute infarction or space-occupying mass lesion are noted. Vascular: No hyperdense vessel or unexpected calcification. Skull: Normal. Negative for fracture or focal lesion. Sinuses/Orbits: No acute finding. Other: None. IMPRESSION: Chronic atrophic and ischemic changes similar to that noted on the prior exam. No acute abnormality is noted. Electronically Signed   By: Inez Catalina M.D.   On: 12/31/2019 19:00   DG Chest Port 1 View  Result Date: 12/31/2019 CLINICAL DATA:  Syncopal episode. EXAM: PORTABLE CHEST 1 VIEW COMPARISON:  December 12, 2019 FINDINGS: The lungs are hyperinflated. Mild, chronic appearing increased lung markings are seen. There is no evidence of acute infiltrate, pleural effusion or pneumothorax. The heart  size and mediastinal contours are within normal limits. There is mild calcification of the aortic arch. The visualized skeletal structures are unremarkable. IMPRESSION: No active disease. Electronically Signed   By: Virgina Norfolk M.D.   On: 12/31/2019 18:30     EKG: Independently reviewed, with result as described above.    Assessment/Plan   Everlee Quakenbush is a 86 y.o. female with medical history significant for advanced Alzheimer's dementia, chronic diastolic heart failure, acquired hypothyroidism, hypertension, and recent COVID-19 infection, who is admitted to [facility] on 12/31/2019 for further evaluation and management of syncope after presenting from home to [facility] Emergency Department complaining of such.    Principal Problem:   Syncope Active Problems:   Alzheimer disease ([place])   Acute lower UTI   Leukocytosis   Dehydration   HTN (hypertension)   #) Syncope: one episode of syncope that occurred as the patient was attempting to from a seated position after urinating in the toilet, followed by single  presyncopal episode when patient attempted to stand-up from the bathroom floor.  Potentially multifactorial in etiology with suspected contributions from micturition syncope versus potential orthostatic hypotension due to intravascular depletion stemming from family's report of recent diminished oral intake, and complicated by diminished vasoconstrictive compensatory mechanism due to pharmacologic factors, namely home Norvasc and lisinopril.  Seizures are felt to be less likely.  Additionally, clinically, acute ischemic CVA also appears to be less likely.  ACS is also felt to be less likely, with presenting EKG showing no evidence of acute ischemic changes relative to most recent prior EKG, while troponin x2 were found to be nonelevated.  Will evaluate for the possibility of orthostatic contributions by checking orthostatic vital signs.  However, it should be noted that the results of this orthostatic evaluation may be impacted by the liter of IV fluid that the patient has previously received in the ED today.   Plan: I have placed in nursing communication order requesting that orthostatic vital signs be checked and recorded x1 set.  Will refrain from additional IV fluids until orthostatic vital signs of been completed.  For now, will hold home Norvasc and lisinopril, and closely monitor ensuing blood pressures.  Monitor on telemetry.  Fall precautions.  Add on serum magnesium level.  Repeat CMP and CBC in the morning.      #) Dehydration: Based on the appearance of dry oral mucosa membranes as well as laboratory finding of prerenal azotemia, which appears to be on the basis of the patient's report of recent diminished oral intake.  Of note, the patient has received a 1 L normal saline bolus in the ED this evening.   Plan: We will consider initiating gentle IV fluids obtainment of orthostatic vital signs, as above. Will closely monitor for evidence of developing acute volume overload, particularly in the  setting of a documented history of chronic diastolic heart failure.  Monitor strict I's and O's and daily weights.  Repeat BMP in the morning.      #) Acute cystitis: Present urinalysis reflects significant pyuria with the presence of leukocyte Esterace in the absence of any squamous epithelial cells.  In the context of patient's advanced dementia, unable to assess if these urinalysis findings are associated with acute symptoms.  In the absence of this insight, and the presence of leukocytosis, will initiate antibiotic coverage for uncomplicated urinary tract infection.  Of note, blood cultures x2 collected in the ED this evening prior to initiation of antibiotics.  While the patient  does possess a leukocytosis, no additional sirs criteria are met, and therefore criteria are not met for presentation to be considered septic at this time.   Plan: Rocephin 1 g IV daily x3 days.  Monitor for results blood cultures x2 as well as urine culture collected today.  Repeat CBC with differential in the morning.      #) Mildly elevated lactic acid: Presenting lactic acid found to be 2.1.  Of note, the patient does not meet criteria to be considered septic, as further established above.  Presenting dehydration is felt to be the more influential factor contributing to mild elevation of lactic acid.  We will also check INR to evaluate synthetic hepatic function.  Of note, 1 L IV normal saline bolus was administered in the ED.  Plan: Repeat lactic acid now, with consideration for resumption of IV fluids if mildly elevated lactic acid has not improved.  Check INR.       #) Chronic diastolic heart failure: Recent echocardiogram, which was performed on 12/13/2019, demonstrated LVEF 55 to 60%, no evidence of LVH, no focal wall motion normalities, but showed grade 1 diastolic dysfunction as well as mild to moderate aortic regurgitation and mild to moderate tricuspid regurgitation.  Not on any diuretics as an  outpatient.  No evidence of acutely decompensated heart failure at this time.  Plan: Monitor strict I's and O's and daily weights.  Repeat BMP in the morning.  Add on serum magnesium level.       #) Acquired hypothyroidism: On Synthroid as an outpatient.  Plan: Continue home Synthroid.       #) Essential hypertension: Outpatient antihypertensive regimen consists of Norvasc 5 mg p.o. daily as well as lisinopril 5 mg p.o. daily.  In the context of suspected orthostatic hypotension, will hold home antihypertensive medications for now.   Plan: Evaluation of orthostatic vital signs, as above.  In the meantime, will hold home antihypertensive medications, as above.      #) Recent COVID-19 infection: As read above, the patient tested positive for COVID-19 by PCR on 12/12/2019, prompting subsequent hospitalization at [facility] from 12/12/2019 to 12/17/2019, as prescribed above.  Per family's report, the patient's respiratory symptoms that were present at the time of this previous hospitalization of subsequently improved, if not completely resolved, and presenting chest x-ray shows no evidence of acute cardiopulmonary process.  No evidence of sepsis at time of today's presentation.  However, and that positive nasopharyngeal COVID-19 PCR result occurred less than 21 days ago, there is an indication for continuation of airborne/contact precautions until the 21 day threshold has been eclipsed.  Clinically, does not appear that the patient warrants pharmacologic intervention or inflammatory marker evaluation at this time.  Plan: Airborne and contact precautions with eye protection, as further described above.  Repeat CBC with differential in the morning.    DVT prophylaxis: SCDs Code Status: Full code Family Communication: The patient's case was discussed with the patient's family, with whom she lives Disposition Plan: Per Rounding Team Consults called: None Admission status: Observation; med  telemetry at [facility].   PLEASE NOTE THAT DRAGON DICTATION SOFTWARE WAS USED IN THE CONSTRUCTION OF THIS NOTE.   [place] [facility] Pager [phone] From [place].   Otherwise, please contact night-coverage  [email or web address] Password [facility]  12/31/2019, 8:39 PM

## 2019-12-31 NOTE — ED Notes (Signed)
Date and time results received: 12/31/19 8:38 PM  (use smartphrase ".now" to insert current time)  Test: lactic acid Critical Value: 2.1  Name of Provider Notified: Chaney Malling, MD  Orders Received? Or Actions Taken?:

## 2019-12-31 NOTE — ED Triage Notes (Signed)
From home via EMS  Family states patient had syncopal episode while using bathroom  Found patient on toilet and assisted patient to the floor  Family and EMS report no injury or trauma  Family reports decreased oral intake the past few days  Hx: dementia  AxO to baseline   Patient tested positive for COVID on 1/16   152/74 60 HR 18 R 96% RA 264 CBG

## 2019-12-31 NOTE — ED Provider Notes (Signed)
[facility] DEPT Provider Note   CSN: [id number] Arrival date & time: 12/31/19  1730     History Chief Complaint  Patient presents with  . Loss of Consciousness    Amanda Sanford is a 86 y.o. female who presents by EMS from home following a witnessed syncopal episode by her family. Per EMS, they noted that she was on the toliet at which time she began to lean sideways at which time they jumped in and lowered her to the floor. They noted a second occurrence when trying to get her off the floor. They deny her having hit her head. They also note about a week history of decreased appetite and poor fluid intake.  Patient's dementia limits her participation to the HPI and ROS.    Patient Active Problem List   Diagnosis Date Noted  . Alzheimer disease ([place]) 12/16/2019  . Hypertensive urgency 12/16/2019  . Acute metabolic encephalopathy 71/04/2693  . Ambulatory dysfunction 12/13/2019  . Pneumonia due to COVID-19 virus 12/13/2019    History reviewed. No pertinent surgical history.   OB History   No obstetric history on file.     No family history on file.  Social History   Tobacco Use  . Smoking status: Never Smoker  . Smokeless tobacco: Never Used  Substance Use Topics  . Alcohol use: Never  . Drug use: Never    Home Medications Prior to Admission medications   Medication Sig Start Date End Date Taking? Authorizing Provider  amLODipine (NORVASC) 5 MG tablet Take 1 tablet (5 mg total) by mouth daily. 12/17/19   Debbe Odea, MD  ascorbic acid (VITAMIN C) 500 MG tablet Take 1 tablet (500 mg total) by mouth daily. 12/17/19   Debbe Odea, MD  cyanocobalamin (,VITAMIN B-12,) 1000 MCG/ML injection Inject 1 mL into the muscle every 7 (seven) days. 12/02/19   [provider]  feeding supplement, ENSURE ENLIVE, (ENSURE ENLIVE) LIQD Take 237 mLs by mouth 2 (two) times daily between meals. 12/17/19   Debbe Odea, MD  folic acid (FOLVITE) 1 MG  tablet Take 1 tablet (1 mg total) by mouth daily. 12/17/19   Debbe Odea, MD  levothyroxine (SYNTHROID) 75 MCG tablet Take 75 mcg by mouth daily. 11/30/19   [provider]  lisinopril (ZESTRIL) 5 MG tablet Take 1 tablet (5 mg total) by mouth daily. 12/17/19   Debbe Odea, MD  memantine (NAMENDA) 5 MG tablet Take 5 mg by mouth 2 (two) times daily. AM and PM    [provider]  Multiple Vitamin (MULTIVITAMIN WITH MINERALS) TABS tablet Take 1 tablet by mouth daily. 12/17/19   Debbe Odea, MD  sertraline (ZOLOFT) 50 MG tablet Take 50 mg by mouth daily. 11/30/19   [provider]  zinc sulfate 220 (50 Zn) MG capsule Take 1 capsule (220 mg total) by mouth daily. 12/17/19   Debbe Odea, MD    Allergies    Patient has no known allergies.  Review of Systems   Review of Systems  Unable to perform ROS: Dementia  LEVEL 5 CAVIAT  Physical Exam Updated Vital Signs BP (!) 156/65   Pulse 63   Temp (!) 97.4 F (36.3 C) (Oral)   Resp 14   SpO2 100%   Physical Exam Constitutional:      General: She is not in acute distress.    Appearance: She is not toxic-appearing.  HENT:     Head: Normocephalic and atraumatic. No laceration.     Comments: Tenderness  to palpation over the occipital region    Nose: Nose normal.  Eyes:     Extraocular Movements: Extraocular movements intact.     Pupils: Pupils are equal, round, and reactive to light.  Neck:     Comments: Tenderness to palpation over the left cervical paraspinal muscles. Cardiovascular:     Rate and Rhythm: Normal rate and regular rhythm.     Heart sounds: Murmur present.  Pulmonary:     Effort: No respiratory distress.     Breath sounds: Wheezing and rhonchi present.  Abdominal:     General: Abdomen is flat. Bowel sounds are normal.  Musculoskeletal:        General: No swelling, tenderness, deformity or signs of injury.     Right lower leg: No edema.     Left lower leg: No edema.  Lymphadenopathy:      Cervical: No cervical adenopathy.  Skin:    General: Skin is warm and dry.     Comments: No apparent hematomas  Neurological:     Mental Status: She is alert.     Cranial Nerves: Cranial nerves are intact.     Motor: Motor function is intact.     Comments: 5/5 strength of the bilateral upper and lower extremities that is equal     ED Results / Procedures / Treatments   Labs (all labs ordered are listed, but only abnormal results are displayed) Labs Reviewed  CBC WITH DIFFERENTIAL/PLATELET - Abnormal; Notable for the following components:      Result Value   WBC 17.6 (*)    RBC 3.77 (*)    Hemoglobin 11.4 (*)    HCT 35.2 (*)    Neutro Abs 15.6 (*)    Monocytes Absolute 1.1 (*)    Abs Immature Granulocytes 0.10 (*)    All other components within normal limits  COMPREHENSIVE METABOLIC PANEL - Abnormal; Notable for the following components:   Sodium 134 (*)    Glucose, Bld 118 (*)    BUN 24 (*)    Creatinine, Ser 1.04 (*)    Albumin 3.4 (*)    GFR calc non Af Amer 49 (*)    GFR calc Af Amer 57 (*)    All other components within normal limits  BRAIN NATRIURETIC PEPTIDE - Abnormal; Notable for the following components:   B Natriuretic Peptide 110.1 (*)    All other components within normal limits  URINALYSIS, ROUTINE W REFLEX MICROSCOPIC - Abnormal; Notable for the following components:   APPearance HAZY (*)    Leukocytes,Ua SMALL (*)    All other components within normal limits  CBG MONITORING, ED - Abnormal; Notable for the following components:   Glucose-Capillary 130 (*)    All other components within normal limits  URINE CULTURE  CULTURE, BLOOD (ROUTINE X 2)  CULTURE, BLOOD (ROUTINE X 2)  LACTIC ACID, PLASMA  LACTIC ACID, PLASMA  TROPONIN I (HIGH SENSITIVITY)  TROPONIN I (HIGH SENSITIVITY)    EKG EKG Interpretation  Date/Time:  Thursday December 31 2019 17:47:48 EST Ventricular Rate:  62 PR Interval:    QRS Duration: 95 QT Interval:  418 QTC  Calculation: 425 R Axis:   46 Text Interpretation: Sinus rhythm Atrial premature complex No significant change since last tracing Confirmed by Richardean Canal [phone]) on 12/31/2019 6:05:52 PM   Radiology CT Head Wo Contrast  Result Date: 12/31/2019 CLINICAL DATA:  Recent syncopal episode EXAM: CT HEAD WITHOUT CONTRAST TECHNIQUE: Contiguous axial images were obtained from the base  of the skull through the vertex without intravenous contrast. COMPARISON:  12/12/2019 FINDINGS: Brain: Mild atrophic changes and chronic white matter ischemic changes are seen. These are similar to that noted on the prior CT examination. Multiple prior lacunar infarcts are seen and stable. No findings to suggest acute hemorrhage, acute infarction or space-occupying mass lesion are noted. Vascular: No hyperdense vessel or unexpected calcification. Skull: Normal. Negative for fracture or focal lesion. Sinuses/Orbits: No acute finding. Other: None. IMPRESSION: Chronic atrophic and ischemic changes similar to that noted on the prior exam. No acute abnormality is noted. Electronically Signed   By: Alcide Clever M.D.   On: 12/31/2019 19:00   DG Chest Port 1 View  Result Date: 12/31/2019 CLINICAL DATA:  Syncopal episode. EXAM: PORTABLE CHEST 1 VIEW COMPARISON:  December 12, 2019 FINDINGS: The lungs are hyperinflated. Mild, chronic appearing increased lung markings are seen. There is no evidence of acute infiltrate, pleural effusion or pneumothorax. The heart size and mediastinal contours are within normal limits. There is mild calcification of the aortic arch. The visualized skeletal structures are unremarkable. IMPRESSION: No active disease. Electronically Signed   By: Aram Candela M.D.   On: 12/31/2019 18:30    Procedures Procedures (including critical care time)  Medications Ordered in ED Medications  sodium chloride 0.9 % bolus 1,000 mL (1,000 mLs Intravenous New Bag/Given 12/31/19 1947)    ED Course  I have reviewed the  triage vital signs and the nursing notes.  Pertinent labs & imaging results that were available during my care of the patient were reviewed by me and considered in my medical decision making (see chart for details).  Clinical Course as of Dec 30 2033  Thu Dec 31, 2019  1844 No infiltrate. Hyperinflation suggests possible history of COPD.  DG Chest Port 1 View [RC]  1901 -EKG unremarkable. No findings to suggest ACS, arrhythmia, or other cardiac pathology.  -CXR not significant for an infiltrate. I did appreciate wheezes and bibasilar crackles on exam however and with the hyperinflation demonstrated on CXR, I think this deserves some further consideration. -no mediastinal widening to suggest aortic dissection    [RC]  1912 White count with left shift concerning for infectious etiology. No infiltrate on CXR. UA still pending.  CBC with Differential/Platelet(!) [RC]  1914 No acute findings on head CT  CT Head Wo Contrast [RC]  1922 Diarrhea for months. cough   [RC]  1956 BNP mildly elevated.   Brain natriuretic peptide(!) [RC]  52 Spoke with patient's daughter in law who was present during the syncopal episode. Essentially reiterated the story we got from EMS.   [RC]  2002 Mild AKI--likely prerenal 2/2 dehydration from poor po intake over the past couple of days. Giving 1L IVF   [RC]  2004 UTI r/o with essentially unremarkable UA  Urinalysis, Routine w reflex microscopic(!) [RC]  2012 No significant electrolyte derangement to account for syncopal episode.  Comprehensive metabolic panel(!) [RC]    Clinical Course User Index [RC] Elige Radon, MD   MDM Rules/Calculators/A&P                     86 yo female who presents today for 2 witnessed syncopal episodes. Initial thought would that it was likely vasovagal however with the second episode, I think this becomes less clear. Additionally, with the decreased appetite and po intake, it makes me think that there may be more going on  or else it may have been precipitated by dehydration. ACS  workup unremarkable. Syncopal episode likely precipitated by dehydration. Given her other symptoms over the past week in addition to her white count of 18, I would suspect an infectious etiology for this. UA not suggestive of UTI no CXR neg for infiltrate. As no specific source of infection found yet at this time, will hold off on abx. No arrhythmia or electrolyte derangements to account for this.   Head CT unremarkable for acute pathology. No acute neurologic deficits concerning for stroke.  Will admit to hospital service for further syncopal workup and IVF.     Final Clinical Impression(s) / ED Diagnoses Final diagnoses:  Syncope, unspecified syncope type  AKI (acute kidney injury) (HCC)  Leukocytosis, unspecified type    Rx / DC Orders ED Discharge Orders    None       Elige Radon, MD 12/31/19 2038    Charlynne Pander, MD 01/01/20 [phone]

## 2019-12-31 NOTE — ED Notes (Signed)
ED TO INPATIENT HANDOFF REPORT  ED Nurse Name and Phone #: Carlena Bjornstad RN [phone]  S Name/Age/Gender Amanda Sanford 86 y.o. female Room/Bed: WA04/WA04  Code Status   Code Status: Full Code  Home/SNF/Other Home Patient oriented to: self, place and time Is this baseline? Yes   Triage Complete: Triage complete  Chief Complaint Syncope [R55]  Triage Note From home via EMS  Family states patient had syncopal episode while using bathroom  Found patient on toilet and assisted patient to the floor  Family and EMS report no injury or trauma  Family reports decreased oral intake the past few days  Hx: dementia  AxO to baseline   Patient tested positive for COVID on 1/16   152/74 60 HR 18 R 96% RA 264 CBG      Allergies No Known Allergies  Level of Care/Admitting Diagnosis ED Disposition    ED Disposition Condition Comment   Admit  Hospital Area: [facility] [[id number]]  Level of Care: Telemetry [5]  Covid Evaluation: Confirmed COVID Positive  Diagnosis: Syncope [206001]  Admitting Physician: Angie Fava [[id number]]  Attending Physician: Angie Fava [[id number]]       B Medical/Surgery History Past Medical History:  Diagnosis Date  . Alzheimer's disease (HCC)   . Benign essential HTN   . Chronic diastolic heart failure (HCC)   . Hypothyroid    History reviewed. No pertinent surgical history.   A IV Location/Drains/Wounds Patient Lines/Drains/Airways Status   Active Line/Drains/Airways    Name:   Placement date:   Placement time:   Site:   Days:   Peripheral IV Left   --    --    --      Peripheral IV 12/31/19 Left Wrist   12/31/19    1949    Wrist   less than 1   Peripheral IV 12/31/19 Right Wrist   12/31/19    1949    Wrist   less than 1   External Urinary Catheter   12/31/19    1805    --   less than 1          Intake/Output Last 24 hours No intake or output data in the 24 hours ending 12/31/19  2320  Labs/Imaging Results for orders placed or performed during the hospital encounter of 12/31/19 (from the past 48 hour(s))  CBG monitoring, ED     Status: Abnormal   Collection Time: 12/31/19  6:06 PM  Result Value Ref Range   Glucose-Capillary 130 (H) 70 - 99 mg/dL  CBC with Differential/Platelet     Status: Abnormal   Collection Time: 12/31/19  6:51 PM  Result Value Ref Range   WBC 17.6 (H) 4.0 - 10.5 K/uL   RBC 3.77 (L) 3.87 - 5.11 MIL/uL   Hemoglobin 11.4 (L) 12.0 - 15.0 g/dL   HCT 29.5 (L) 18.8 - 41.6 %   MCV 93.4 80.0 - 100.0 fL   MCH 30.2 26.0 - 34.0 pg   MCHC 32.4 30.0 - 36.0 g/dL   RDW 60.6 30.1 - 60.1 %   Platelets 298 150 - 400 K/uL   nRBC 0.0 0.0 - 0.2 %   Neutrophils Relative % 89 %   Neutro Abs 15.6 (H) 1.7 - 7.7 K/uL   Lymphocytes Relative 4 %   Lymphs Abs 0.7 0.7 - 4.0 K/uL   Monocytes Relative 6 %   Monocytes Absolute 1.1 (H) 0.1 - 1.0 K/uL   Eosinophils Relative 0 %  Eosinophils Absolute 0.0 0.0 - 0.5 K/uL   Basophils Relative 0 %   Basophils Absolute 0.0 0.0 - 0.1 K/uL   Immature Granulocytes 1 %   Abs Immature Granulocytes 0.10 (H) 0.00 - 0.07 K/uL    Comment: Performed at [facility], [place] [address] [place], [place] [address]  Comprehensive metabolic panel     Status: Abnormal   Collection Time: 12/31/19  6:51 PM  Result Value Ref Range   Sodium 134 (L) 135 - 145 mmol/L   Potassium 4.5 3.5 - 5.1 mmol/L   Chloride 99 98 - 111 mmol/L   CO2 27 22 - 32 mmol/L   Glucose, Bld 118 (H) 70 - 99 mg/dL   BUN 24 (H) 8 - 23 mg/dL   Creatinine, Ser 1.04 (H) 0.44 - 1.00 mg/dL   Calcium 9.3 8.9 - 10.3 mg/dL   Total Protein 7.3 6.5 - 8.1 g/dL   Albumin 3.4 (L) 3.5 - 5.0 g/dL   AST 19 15 - 41 U/L   ALT 20 0 - 44 U/L   Alkaline Phosphatase 93 38 - 126 U/L   Total Bilirubin 0.5 0.3 - 1.2 mg/dL   GFR calc non Af Amer 49 (L) >60 mL/min   GFR calc Af Amer 57 (L) >60 mL/min   Anion gap 8 5 - 15    Comment: Performed at [facility], [place] [address] [place], [place] [address]  Troponin I (High Sensitivity)     Status: None   Collection Time: 12/31/19  6:51 PM  Result Value Ref Range   Troponin I (High Sensitivity) 4 <18 ng/L    Comment: (NOTE) Elevated high sensitivity troponin I (hsTnI) values and significant  changes across serial measurements may suggest ACS but many other  chronic and acute conditions are known to elevate hsTnI results.  Refer to the "Links" section for chest pain algorithms and additional  guidance. Performed at [facility], [place] [address] [place], [place] [address]   Brain natriuretic peptide     Status: Abnormal   Collection Time: 12/31/19  6:51 PM  Result Value Ref Range   B Natriuretic Peptide 110.1 (H) 0.0 - 100.0 pg/mL    Comment: Performed at [facility], [place] [address], [place], [place] [address]  Urinalysis, Routine w reflex microscopic     Status: Abnormal   Collection Time: 12/31/19  7:31 PM  Result Value Ref Range   Color, Urine YELLOW YELLOW   APPearance HAZY (A) CLEAR   Specific Gravity, Urine 1.010 1.005 - 1.030   pH 7.0 5.0 - 8.0   Glucose, UA NEGATIVE NEGATIVE mg/dL   Hgb urine dipstick NEGATIVE NEGATIVE   Bilirubin Urine NEGATIVE NEGATIVE   Ketones, ur NEGATIVE NEGATIVE mg/dL   Protein, ur NEGATIVE NEGATIVE mg/dL   Nitrite NEGATIVE NEGATIVE   Leukocytes,Ua SMALL (A) NEGATIVE   RBC / HPF 0-5 0 - 5 RBC/hpf   WBC, UA 21-50 0 - 5 WBC/hpf   Bacteria, UA NONE SEEN NONE SEEN    Comment: Performed at [facility], [place] [address], [place], [place] [address]  Blood culture (routine x 2)     Status: None (Preliminary result)   Collection Time: 12/31/19  7:31 PM   Specimen: BLOOD RIGHT WRIST  Result Value Ref Range   Specimen Description      BLOOD RIGHT WRIST Performed at [place] [address], [place],  [address]    Special  Requests      BOTTLES DRAWN AEROBIC AND ANAEROBIC Blood  Culture results may not be optimal due to an inadequate volume of blood received in culture bottles Performed at [facility], [address], [place], [place] [address]    Culture PENDING    Report Status PENDING   Lactic acid, plasma     Status: Abnormal   Collection Time: 12/31/19  7:31 PM  Result Value Ref Range   Lactic Acid, Venous 2.1 (HH) 0.5 - 1.9 mmol/L    Comment: CRITICAL RESULT CALLED TO, READ BACK BY AND VERIFIED WITH: REBECCA, RN @ 2037 ON 12/31/19 Riesa Pope Performed at [facility], [address], [place], [place] [address]   Blood culture (routine x 2)     Status: None (Preliminary result)   Collection Time: 12/31/19  7:32 PM   Specimen: BLOOD LEFT WRIST  Result Value Ref Range   Specimen Description      BLOOD LEFT WRIST Performed at [facility], [address] [place], [place] [address]    Special Requests      BOTTLES DRAWN AEROBIC AND ANAEROBIC Blood Culture results may not be optimal due to an inadequate volume of blood received in culture bottles Performed at [facility], [address] [place], [place] [address]    Culture PENDING    Report Status PENDING   Troponin I (High Sensitivity)     Status: None   Collection Time: 12/31/19  7:32 PM  Result Value Ref Range   Troponin I (High Sensitivity) 3 <18 ng/L    Comment: (NOTE) Elevated high sensitivity troponin I (hsTnI) values and significant  changes across serial measurements may suggest ACS but many other  chronic and acute conditions are known to elevate hsTnI results.  Refer to the "Links" section for chest pain algorithms and additional  guidance. Performed at [facility], [address] [place], [place] [address]    CT Head Wo Contrast  Result Date: 12/31/2019 CLINICAL DATA:  Recent syncopal episode EXAM: CT HEAD WITHOUT CONTRAST TECHNIQUE: Contiguous axial images were obtained from the base of the skull  through the vertex without intravenous contrast. COMPARISON:  12/12/2019 FINDINGS: Brain: Mild atrophic changes and chronic white matter ischemic changes are seen. These are similar to that noted on the prior CT examination. Multiple prior lacunar infarcts are seen and stable. No findings to suggest acute hemorrhage, acute infarction or space-occupying mass lesion are noted. Vascular: No hyperdense vessel or unexpected calcification. Skull: Normal. Negative for fracture or focal lesion. Sinuses/Orbits: No acute finding. Other: None. IMPRESSION: Chronic atrophic and ischemic changes similar to that noted on the prior exam. No acute abnormality is noted. Electronically Signed   By: Alcide Clever M.D.   On: 12/31/2019 19:00   DG Chest Port 1 View  Result Date: 12/31/2019 CLINICAL DATA:  Syncopal episode. EXAM: PORTABLE CHEST 1 VIEW COMPARISON:  December 12, 2019 FINDINGS: The lungs are hyperinflated. Mild, chronic appearing increased lung markings are seen. There is no evidence of acute infiltrate, pleural effusion or pneumothorax. The heart size and mediastinal contours are within normal limits. There is mild calcification of the aortic arch. The visualized skeletal structures are unremarkable. IMPRESSION: No active disease. Electronically Signed   By: Aram Candela M.D.   On: 12/31/2019 18:30    Pending Labs Unresulted Labs (From admission, onward)    Start     Ordered   01/01/20 0500  Magnesium  Tomorrow  morning,   R     12/31/19 2159   01/01/20 0500  Comprehensive metabolic panel  Tomorrow morning,   R     12/31/19 2159   01/01/20 0500  CBC WITH DIFFERENTIAL  Tomorrow morning,   R     12/31/19 2159   12/31/19 2202  Magnesium  Add-on,   AD     12/31/19 2203   12/31/19 1912  Urine culture  ONCE - STAT,   STAT     12/31/19 1911   12/31/19 1912  Lactic acid, plasma  Now then every 2 hours,   STAT     12/31/19 1911          Vitals/Pain Today's Vitals   12/31/19 2100 12/31/19 2130 12/31/19  2145 12/31/19 2200  BP: (!) 105/58 (!) 152/63  (!) 153/65  Pulse: 60 63 (!) 59 (!) 56  Resp: (!) 21 16 16 16   Temp:      TempSrc:      SpO2: 99% 100% 100% 99%  PainSc:        Isolation Precautions Airborne and Contact precautions  Medications Medications  donepezil (ARICEPT) tablet 10 mg (has no administration in time range)  memantine (NAMENDA) tablet 5 mg (has no administration in time range)  sertraline (ZOLOFT) tablet 50 mg (has no administration in time range)  levothyroxine (SYNTHROID) tablet 75 mcg (has no administration in time range)  gabapentin (NEURONTIN) capsule 300 mg (has no administration in time range)  feeding supplement (ENSURE ENLIVE) (ENSURE ENLIVE) liquid 237 mL (has no administration in time range)  multivitamin with minerals tablet 1 tablet (has no administration in time range)  sodium chloride flush (NS) 0.9 % injection 3 mL ( Intravenous Canceled Entry 12/31/19 2205)  acetaminophen (TYLENOL) tablet 650 mg (has no administration in time range)    Or  acetaminophen (TYLENOL) suppository 650 mg (has no administration in time range)  cefTRIAXone (ROCEPHIN) 1 g in sodium chloride 0.9 % 100 mL IVPB (1 g Intravenous New Bag/Given 12/31/19 2214)  sodium chloride 0.9 % bolus 1,000 mL (0 mLs Intravenous Stopped 12/31/19 2214)    Mobility walks with person assist High fall risk   Focused Assessments Cardiac Assessment Handoff:    No results found for: CKTOTAL, CKMB, CKMBINDEX, TROPONINI Lab Results  Component Value Date   DDIMER 0.51 (H) 12/17/2019   Does the Patient currently have chest pain? Yes     R Recommendations: See Admitting Provider Note  Report given to:  12/19/2019 RN  Additional Notes:  Notified [facility] to be called for transport

## 2020-01-01 DIAGNOSIS — N390 Urinary tract infection, site not specified: Secondary | ICD-10-CM | POA: Diagnosis present

## 2020-01-01 DIAGNOSIS — Z79890 Hormone replacement therapy (postmenopausal): Secondary | ICD-10-CM | POA: Diagnosis not present

## 2020-01-01 DIAGNOSIS — I110 Hypertensive heart disease with heart failure: Secondary | ICD-10-CM | POA: Diagnosis present

## 2020-01-01 DIAGNOSIS — N179 Acute kidney failure, unspecified: Secondary | ICD-10-CM | POA: Diagnosis present

## 2020-01-01 DIAGNOSIS — Z79899 Other long term (current) drug therapy: Secondary | ICD-10-CM | POA: Diagnosis not present

## 2020-01-01 DIAGNOSIS — G309 Alzheimer's disease, unspecified: Secondary | ICD-10-CM | POA: Diagnosis present

## 2020-01-01 DIAGNOSIS — E860 Dehydration: Secondary | ICD-10-CM | POA: Diagnosis present

## 2020-01-01 DIAGNOSIS — F329 Major depressive disorder, single episode, unspecified: Secondary | ICD-10-CM | POA: Diagnosis present

## 2020-01-01 DIAGNOSIS — Z66 Do not resuscitate: Secondary | ICD-10-CM | POA: Diagnosis present

## 2020-01-01 DIAGNOSIS — Z8619 Personal history of other infectious and parasitic diseases: Secondary | ICD-10-CM | POA: Diagnosis not present

## 2020-01-01 DIAGNOSIS — Z23 Encounter for immunization: Secondary | ICD-10-CM | POA: Diagnosis not present

## 2020-01-01 DIAGNOSIS — I5032 Chronic diastolic (congestive) heart failure: Secondary | ICD-10-CM | POA: Diagnosis present

## 2020-01-01 DIAGNOSIS — I10 Essential (primary) hypertension: Secondary | ICD-10-CM

## 2020-01-01 DIAGNOSIS — R627 Adult failure to thrive: Secondary | ICD-10-CM | POA: Diagnosis present

## 2020-01-01 DIAGNOSIS — N3000 Acute cystitis without hematuria: Secondary | ICD-10-CM | POA: Diagnosis present

## 2020-01-01 DIAGNOSIS — W1811XA Fall from or off toilet without subsequent striking against object, initial encounter: Secondary | ICD-10-CM | POA: Diagnosis present

## 2020-01-01 DIAGNOSIS — Z681 Body mass index (BMI) 19 or less, adult: Secondary | ICD-10-CM | POA: Diagnosis not present

## 2020-01-01 DIAGNOSIS — D72829 Elevated white blood cell count, unspecified: Secondary | ICD-10-CM | POA: Diagnosis present

## 2020-01-01 DIAGNOSIS — F0280 Dementia in other diseases classified elsewhere without behavioral disturbance: Secondary | ICD-10-CM | POA: Diagnosis present

## 2020-01-01 DIAGNOSIS — E039 Hypothyroidism, unspecified: Secondary | ICD-10-CM | POA: Diagnosis present

## 2020-01-01 DIAGNOSIS — I951 Orthostatic hypotension: Secondary | ICD-10-CM

## 2020-01-01 DIAGNOSIS — R55 Syncope and collapse: Secondary | ICD-10-CM | POA: Diagnosis present

## 2020-01-01 DIAGNOSIS — G909 Disorder of the autonomic nervous system, unspecified: Secondary | ICD-10-CM | POA: Diagnosis present

## 2020-01-01 DIAGNOSIS — B957 Other staphylococcus as the cause of diseases classified elsewhere: Secondary | ICD-10-CM | POA: Diagnosis present

## 2020-01-01 DIAGNOSIS — Y92002 Bathroom of unspecified non-institutional (private) residence single-family (private) house as the place of occurrence of the external cause: Secondary | ICD-10-CM | POA: Diagnosis not present

## 2020-01-01 LAB — CBC WITH DIFFERENTIAL/PLATELET
Abs Immature Granulocytes: 0.09 10*3/uL — ABNORMAL HIGH (ref 0.00–0.07)
Basophils Absolute: 0 10*3/uL (ref 0.0–0.1)
Basophils Relative: 0 %
Eosinophils Absolute: 0.2 10*3/uL (ref 0.0–0.5)
Eosinophils Relative: 1 %
HCT: 34.5 % — ABNORMAL LOW (ref 36.0–46.0)
Hemoglobin: 11.2 g/dL — ABNORMAL LOW (ref 12.0–15.0)
Immature Granulocytes: 1 %
Lymphocytes Relative: 9 %
Lymphs Abs: 1.3 10*3/uL (ref 0.7–4.0)
MCH: 29.9 pg (ref 26.0–34.0)
MCHC: 32.5 g/dL (ref 30.0–36.0)
MCV: 92.2 fL (ref 80.0–100.0)
Monocytes Absolute: 1 10*3/uL (ref 0.1–1.0)
Monocytes Relative: 7 %
Neutro Abs: 11.4 10*3/uL — ABNORMAL HIGH (ref 1.7–7.7)
Neutrophils Relative %: 82 %
Platelets: 300 10*3/uL (ref 150–400)
RBC: 3.74 MIL/uL — ABNORMAL LOW (ref 3.87–5.11)
RDW: 13.2 % (ref 11.5–15.5)
WBC: 14 10*3/uL — ABNORMAL HIGH (ref 4.0–10.5)
nRBC: 0 % (ref 0.0–0.2)

## 2020-01-01 LAB — MAGNESIUM: Magnesium: 2.2 mg/dL (ref 1.7–2.4)

## 2020-01-01 LAB — COMPREHENSIVE METABOLIC PANEL
ALT: 19 U/L (ref 0–44)
AST: 18 U/L (ref 15–41)
Albumin: 3 g/dL — ABNORMAL LOW (ref 3.5–5.0)
Alkaline Phosphatase: 92 U/L (ref 38–126)
Anion gap: 8 (ref 5–15)
BUN: 18 mg/dL (ref 8–23)
CO2: 25 mmol/L (ref 22–32)
Calcium: 8.9 mg/dL (ref 8.9–10.3)
Chloride: 106 mmol/L (ref 98–111)
Creatinine, Ser: 0.8 mg/dL (ref 0.44–1.00)
GFR calc Af Amer: >60 mL/min (ref >60)
GFR calc non Af Amer: >60 mL/min (ref >60)
Glucose, Bld: 97 mg/dL (ref 70–99)
Potassium: 4.6 mmol/L (ref 3.5–5.1)
Sodium: 139 mmol/L (ref 135–145)
Total Bilirubin: 0.4 mg/dL (ref 0.3–1.2)
Total Protein: 6.8 g/dL (ref 6.5–8.1)

## 2020-01-01 MED ORDER — ENSURE ENLIVE PO LIQD
237.0000 mL | Freq: Three times a day (TID) | ORAL | Status: DC
  Administered 2020-01-01 – 2020-01-04 (×8): 237 mL via ORAL

## 2020-01-01 MED ORDER — MIRTAZAPINE 15 MG PO TABS
7.5000 mg | ORAL_TABLET | Freq: Every day | ORAL | Status: DC
  Administered 2020-01-01 – 2020-01-03 (×3): 7.5 mg via ORAL
  Filled 2020-01-01 (×3): qty 1

## 2020-01-01 MED ORDER — PNEUMOCOCCAL VAC POLYVALENT 25 MCG/0.5ML IJ INJ
0.5000 mL | INJECTION | INTRAMUSCULAR | Status: AC
  Administered 2020-01-02: 0.5 mL via INTRAMUSCULAR
  Filled 2020-01-01: qty 0.5

## 2020-01-01 MED ORDER — SODIUM CHLORIDE 0.9 % IV SOLN: INTRAVENOUS | Status: AC

## 2020-01-01 MED ORDER — INFLUENZA VAC A&B SA ADJ QUAD 0.5 ML IM PRSY
0.5000 mL | PREFILLED_SYRINGE | INTRAMUSCULAR | Status: AC
  Administered 2020-01-02: 0.5 mL via INTRAMUSCULAR
  Filled 2020-01-01: qty 0.5

## 2020-01-01 NOTE — Evaluation (Addendum)
Physical Therapy Evaluation Patient Details Name: Amanda Sanford MRN: [id number] DOB: 1932-10-11 Today's Date: 01/01/2020   History of Present Illness  Pt is an 86 y.o. female recently admitted to [facility] with COVID-19 PNA (12/12/19-12/17/19), now admitted 12/31/19 with syncopal event witnessed by family. Etiology likely vasovagal versus orthostasis. Head CT negative for acute abnormality. PMH includes advanced Alzheimer's dementia, CHF, HTN.    Clinical Impression  Pt presents with an overall decrease in functional mobility secondary to above. PTA, pt independent with mobility and ADLs, lives with family who assist with IADL tasks . Today, pt able to initiate transfer and gait training with and without DME, requires min guard for balance. (-) orthostatic hypotension this session (see values below), pt asymptomatic. Pt would benefit from continued acute PT services to maximize functional mobility and independence prior to d/c with HHPT services and increased support from family.   Orthostatic BPs Supine 113/69  Sitting 110/68  Standing NT-pt urgently needing to use BSC  Post-mobility 117/66      Follow Up Recommendations Home health PT;Supervision/Assistance - 24 hour    Equipment Recommendations  None recommended by PT    Recommendations for Other Services Palliative Care Consult     Precautions / Restrictions Precautions Precautions: Fall Restrictions Weight Bearing Restrictions: No      Mobility  Bed Mobility Overal bed mobility: Needs Assistance Bed Mobility: Supine to Sit Rolling: Supervision         General bed mobility comments: Bed flat, no physical assist required; denies dizziness  Transfers Overall transfer level: Needs assistance Equipment used: Rolling walker (2 wheeled) Transfers: Sit to/from Stand Sit to Stand: Supervision;Min guard         General transfer comment: Stood from EOB with RW at supervision-level; min guard without DME standing from [facility]; denies  dizziness, BP stable  Ambulation/Gait Ambulation/Gait assistance: Min guard Gait Distance (Feet): 30 Feet Assistive device: Rolling walker (2 wheeled);None Gait Pattern/deviations: Step-through pattern;Decreased stride length;Trunk flexed   Gait velocity interpretation: 1.31 - 2.62 ft/sec, indicative of limited community ambulator General Gait Details: Initial use of RW with intermittent min guard for balance; pt took a few steps without DME, reaching to recliner for UE support  Stairs            Wheelchair Mobility    Modified Rankin (Stroke Patients Only)       Balance Overall balance assessment: Needs assistance   Sitting balance-Leahy Scale: Good Sitting balance - Comments: Able to perform posterior pericare while seated on BSC     Standing balance-Leahy Scale: Fair Standing balance comment: Could static stand without UE support; dynamic stability improved with UE support                             Pertinent Vitals/Pain Pain Assessment: Faces Faces Pain Scale: Hurts little more Pain Location: Rectum when attempting bowel movement Pain Descriptors / Indicators: Discomfort;Grimacing;Moaning Pain Intervention(s): Monitored during session;Patient requesting pain meds-RN notified    Home Living Family/patient expects to be discharged to:: Private residence Living Arrangements: Spouse/significant other;Children Available Help at Discharge: Family;Available 24 hours/day Type of Home: House Home Access: Stairs to enter Entrance Stairs-Rails: Right Entrance Stairs-Number of Steps: 4 Home Layout: Two level;Bed/bath upstairs Home Equipment: Shower seat;Grab bars - tub/shower;Grab bars - toilet;Walker - 2 wheels;Walker - 4 wheels;Cane - single point;Wheelchair - manual Additional Comments: Pt poor historian; info from OT speaking with son    Prior Function Level of Independence: Needs  assistance   Gait / Transfers Assistance Needed: Was ambulatory without  DME; only started using a RW the past week due to feeling dizzy and weak  ADL's / Homemaking Assistance Needed: Independent with bathing and dressing  Comments: decline over past 3 months; son states she doesn't do anything; poor appetite; recent fall in the bathroom after syncopal event     Hand Dominance   Dominant Hand: Right    Extremity/Trunk Assessment   Upper Extremity Assessment Upper Extremity Assessment: Generalized weakness    Lower Extremity Assessment Lower Extremity Assessment: Generalized weakness    Cervical / Trunk Assessment Cervical / Trunk Assessment: Kyphotic  Communication   Communication: HOH  Cognition Arousal/Alertness: Awake/alert Behavior During Therapy: WFL for tasks assessed/performed Overall Cognitive Status: History of cognitive impairments - at baseline                                 General Comments: Oriented to self and "hospital" - reoriented to city, state and name of hospital, pt asking same questions multiple times. Reports she is only here recently visiting her son. Following simple commands appropriately; internally distracted by abdominal/rectal pain from constipation      General Comments      Exercises     Assessment/Plan    PT Assessment Patient needs continued PT services  PT Problem List Decreased strength;Decreased mobility;Decreased activity tolerance;Decreased balance;Decreased knowledge of use of DME;Pain;Decreased safety awareness       PT Treatment Interventions DME instruction;Therapeutic activities;Gait training;Therapeutic exercise;Patient/family education;Balance training;Stair training;Functional mobility training    PT Goals (Current goals can be found in the Care Plan section)  Acute Rehab PT Goals Patient Stated Goal: per son for pt to return home PT Goal Formulation: With patient/family Time For Goal Achievement: 01/15/20 Potential to Achieve Goals: Good    Frequency Min 3X/week    Barriers to discharge        Co-evaluation               AM-PAC PT "6 Clicks" Mobility  Outcome Measure Help needed turning from your back to your side while in a flat bed without using bedrails?: None Help needed moving from lying on your back to sitting on the side of a flat bed without using bedrails?: None Help needed moving to and from a bed to a chair (including a wheelchair)?: A Little Help needed standing up from a chair using your arms (e.g., wheelchair or bedside chair)?: A Little Help needed to walk in hospital room?: A Little Help needed climbing 3-5 steps with a railing? : A Little 6 Click Score: 20    End of Session   Activity Tolerance: Patient tolerated treatment well Patient left: in chair;with chair alarm set;with call bell/phone within reach Nurse Communication: Mobility status PT Visit Diagnosis: Other abnormalities of gait and mobility (R26.89);Unsteadiness on feet (R26.81)    Time: 1610-9604 PT Time Calculation (min) (ACUTE ONLY): 24 min   Charges:   PT Evaluation $PT Eval Moderate Complexity: 1 Mod PT Treatments $Therapeutic Activity: 8-22 mins   Ina Homes, PT, DPT Acute Rehabilitation Services  Pager [phone] Office [phone]  Malachy Chamber 01/01/2020, 3:30 PM

## 2020-01-01 NOTE — Progress Notes (Signed)
Initial Nutrition Assessment  DOCUMENTATION CODES:   Underweight  INTERVENTION:   Liberalize diet to regular.  Ensure Enlive po TID, each supplement provides 350 kcal and 20 grams of protein.  Pt receiving Hormel Shake daily with Breakfast which provides 520 kcals and 22 g of protein and Magic cup BID with lunch and dinner, each supplement provides 290 kcal and 9 grams of protein, automatically on meal trays to optimize nutritional intake.   Continue MVI daily.  NUTRITION DIAGNOSIS:   Increased nutrient needs related to acute illness(COVID, underweight status) as evidenced by estimated needs.  GOAL:   Patient will meet greater than or equal to 90% of their needs  MONITOR:   PO intake, Supplement acceptance, Labs  REASON FOR ASSESSMENT:   Malnutrition Screening Tool, Consult Assessment of nutrition requirement/status  ASSESSMENT:   86 yo female admitted with syncope (vasovagal vs orthostasis). PMH includes advanced Alzheimer's dementia, CHF, hypothyroidism, HTN, recent COVID-19 PNA (hospitalized 1/16-1/21).   Patient is currently on room air. She is on a heart healthy diet, no meal intakes recorded.   Labs reviewed.  CBG's: 130 (2/4)  Medications reviewed and include remeron, MVI.  Recent weights reviewed. No weight loss noted.  Patient weighed 44.3 kg on 1/17, 45.36 kg on current admission.  Patient is underweight and would benefit from PO supplements to help meet nutrition needs.  NUTRITION - FOCUSED PHYSICAL EXAM:  unable to complete  Diet Order:   Diet Order            Diet Heart Room service appropriate? Yes; Fluid consistency: Thin  Diet effective now              EDUCATION NEEDS:   No education needs have been identified at this time  Skin:  Skin Assessment: Reviewed RN Assessment  Last BM:  2/5 type 7  Height:   Ht Readings from Last 1 Encounters:  01/01/20 5\' 3"  (1.6 m)    Weight:   Wt Readings from Last 1 Encounters:  01/01/20  45.4 kg    Ideal Body Weight:  52.3 kg  BMI:  Body mass index is 17.71 kg/m.  Estimated Nutritional Needs:   Kcal:  1300-1500  Protein:  70-85 gm  Fluid:  >/= 1.4 L    02/29/20, RD, LDN, CNSC Contact information can be found in Amion.

## 2020-01-01 NOTE — ED Notes (Signed)
Family updated as to patient's status. Amanda Sanford son called and told patient is being moved to [facility] room 157.  [facility] is here to get the patient.

## 2020-01-01 NOTE — Progress Notes (Signed)
   01/01/20 0708  Family/Significant Other Communication  Family/Significant Other Update Called;Updated  Called and updated Son. All questions asked and all questions answered.

## 2020-01-01 NOTE — Progress Notes (Signed)
PROGRESS NOTE    Amanda Sanford  EPP:[id number] DOB: 11-22-1932 DOA: 12/31/2019 PCP: Ward, Marcelline Deist, PA-C    Brief Narrative:   Amanda Sanford is an 86 year old Caucasian female with past medical history significant for advanced Alzheimer's dementia, chronic diastolic congestive heart failure, hypothyroidism, HTN, and recent Covid-19 viral pneumonia who initially presented to [facility] long emergency department following syncopal episode.  Family reported that patient was assisted to the bathroom and on her attempt to rise from the seated position that she lost consciousness and fell forward to the bathroom floor.  Event was witnessed by patient's family and no reports of tonic-clonic activity or loss of bowel/bladder function/tongue biting.  Recently hospitalized from 12/12/2019 through 1/212021 with Covid-19 viral pneumonia completed 5-day course of remdesivir.  In the ED, patient was afebrile, heart rate range 59-78, with BP 105-58-156/65.  RR 14, SPO2 97% on room air.  Sodium 134, potassium 4.5, BUN 24, creatinine 1.04, glucose 118.  Troponin 4 and 3.  Urinalysis hazy with 21-50 WBCs, small leukoesterase, negative nitrite.  WBC count 17.6, hemoglobin 11.4, lactic acid 2.1.  EKG with NSR, RR 62, no concerning ST elevation/depression or T wave inversions.  Chest x-ray without evidence of acute cardiopulmonary disease process.  CT head no acute intracranial process.  Patient was given 1 L NS and transferred to [facility] to continue airborne/contact isolation given the less than 21 days post Covid diagnosis and further work-up and evaluation of syncope likely related to vasovagal versus orthostasis.   Assessment & Plan:   Principal Problem:   Syncope Active Problems:   Alzheimer disease (HCC)   Acute lower UTI   Leukocytosis   Dehydration   HTN (hypertension)  Syncope Etiology likely secondary to vasovagal versus orthostasis.  Patient was getting up from using the restroom during the episode.   This is likely complicated by her continued use of antihypertensives with amlodipine and lisinopril; in addition to her underlying advanced Alzheimer's dementia with likely autonomic dysfunction.  CT head without acute intracranial disease process. Orthostatic vital signs positive with SBP drop from 101 to 59 from a lying to standing position. --Nursing reports positive orthostatic vital signs this morning --Start IV fluid hydration with NS at 75 mL's per hour --Continue to hold home antihypertensives lisinopril/amlodipine --Continue to monitor on telemetry for any concerning arrhythmias --Supportive care --PT/OT evaluation --Daily orthostatic vital signs  Acute cystitis without hematuria Urinalysis with small leukoesterase, negative nitrite, 21-50 WBCs. --Blood cultures x2: Pending --Urine culture: Pending --Continue empiric antibiotics with ceftriaxone 1 g IV every 24 hours  Recent Covid-19 viral pneumonia Hospitalized from 12/12/2019 through 12/17/2019; completed 5-day course of remdesivir.  Currently oxygenating well on room air.  Chest x-ray with no acute cardiopulmonary process. --Continue supportive care  Advanced Alzheimer's dementia --Continue donepezil 10 mg p.o. nightly --Continue memantine 5 mg p.o. twice daily  Depression: Continue sertraline 50 mg p.o. daily  Hypothyroidism --Continue levothyroxine 75 mcg p.o. daily  Essential hypertension Significantly orthostatic with systolic BP from a lying to standing change from a 101 to 59. --Continue to hold home antihypertensives --Continue IV fluid hydration --Continue close monitoring of BP  Chronic diastolic congestive heart failure, compensated TTE 12/13/2019 with LVEF 55-60%, no LVH, no focal wall motion abnormalities, grade 1 diastolic dysfunction and mild/moderate aortic regurgitation with mild/moderate TR. --Continue to monitor strict I's and O's and daily weights especially in the setting of IV fluid hydration as  above  DVT prophylaxis: SCDs Code Status: Full code, patient's son Maisie Fus discussed with other  siblings regarding possible change to DNR Family Communication: Updated patient's son, Maisie Fus via telephone this morning Disposition Plan: Anticipate discharge back home once orthostasis improved and pending PT/OT evaluation   Consultants:   None  Procedures:   None  Antimicrobials:   Ceftriaxone 2/4>>   Subjective: Patient seen and examined bedside, resting comfortably in bed.  Pleasantly confused.   Nursing reports during orthostatic vital signs this morning, patient with significant drop in her systolic blood pressure from 101 to 59 with complaints of dizziness.  No other specific complaints this morning.  Patient denies headache, no visual changes, no chest pain, no palpitations, no nausea/vomiting/diarrhea, no shortness of breath, no abdominal pain, no fatigue.  No other acute events overnight per nursing staff.  Objective: Vitals:   01/01/20 0217 01/01/20 0304 01/01/20 0400 01/01/20 0708  BP: (!) 141/81  (!) 127/59 114/66  Pulse: (!) 59  (!) 53 60  Resp: 16  (!) 21 14  Temp: 98.4 F (36.9 C)   98.9 F (37.2 C)  TempSrc: Oral   Oral  SpO2: 100%  100% 99%  Weight: 45.4 kg 45.4 kg    Height: 5\' 3"  (1.6 m)       Intake/Output Summary (Last 24 hours) at 01/01/2020 0935 Last data filed at 01/01/2020 0700 Gross per 24 hour  Intake 250 ml  Output --  Net 250 ml   Filed Weights   01/01/20 0217 01/01/20 0304  Weight: 45.4 kg 45.4 kg    Examination:  General exam: Appears calm and comfortable, pleasantly confused Respiratory system: Clear to auscultation. Respiratory effort normal.  Oxygenating well on room air Cardiovascular system: S1 & S2 heard, RRR. No JVD, murmurs, rubs, gallops or clicks. No pedal edema. Gastrointestinal system: Abdomen is nondistended, soft and nontender. No organomegaly or masses felt. Normal bowel sounds heard. Central nervous system: Alert. No focal  neurological deficits. Extremities: Symmetric 5 x 5 power. Skin: No rashes, lesions or ulcers Psychiatry: Judgement and insight appear poor. Mood & affect appropriate.     Data Reviewed: I have personally reviewed following labs and imaging studies  CBC: Recent Labs  Lab 12/31/19 1851 01/01/20 0330  WBC 17.6* 14.0*  NEUTROABS 15.6* 11.4*  HGB 11.4* 11.2*  HCT 35.2* 34.5*  MCV 93.4 92.2  PLT 298 300   Basic Metabolic Panel: Recent Labs  Lab 12/31/19 1851 12/31/19 1932 01/01/20 0330  NA 134*  --  139  K 4.5  --  4.6  CL 99  --  106  CO2 27  --  25  GLUCOSE 118*  --  97  BUN 24*  --  18  CREATININE 1.04*  --  0.80  CALCIUM 9.3  --  8.9  MG  --  2.1 2.2   GFR: Estimated Creatinine Clearance: 36.8 mL/min (by C-G formula based on SCr of 0.8 mg/dL). Liver Function Tests: Recent Labs  Lab 12/31/19 1851 01/01/20 0330  AST 19 18  ALT 20 19  ALKPHOS 93 92  BILITOT 0.5 0.4  PROT 7.3 6.8  ALBUMIN 3.4* 3.0*   No results for input(s): LIPASE, AMYLASE in the last 168 hours. No results for input(s): AMMONIA in the last 168 hours. Coagulation Profile: No results for input(s): INR, PROTIME in the last 168 hours. Cardiac Enzymes: No results for input(s): CKTOTAL, CKMB, CKMBINDEX, TROPONINI in the last 168 hours. BNP (last 3 results) No results for input(s): PROBNP in the last 8760 hours. HbA1C: No results for input(s): HGBA1C in the last 72 hours. CBG:  Recent Labs  Lab 12/31/19 1806  GLUCAP 130*   Lipid Profile: No results for input(s): CHOL, HDL, LDLCALC, TRIG, CHOLHDL, LDLDIRECT in the last 72 hours. Thyroid Function Tests: No results for input(s): TSH, T4TOTAL, FREET4, T3FREE, THYROIDAB in the last 72 hours. Anemia Panel: No results for input(s): VITAMINB12, FOLATE, FERRITIN, TIBC, IRON, RETICCTPCT in the last 72 hours. Sepsis Labs: Recent Labs  Lab 12/31/19 1931 12/31/19 2112  LATICACIDVEN 2.1* 1.4    Recent Results (from the past 240 hour(s))  Blood  culture (routine x 2)     Status: None (Preliminary result)   Collection Time: 12/31/19  7:31 PM   Specimen: BLOOD RIGHT WRIST  Result Value Ref Range Status   Specimen Description   Final    BLOOD RIGHT WRIST Performed at [facility] [address], [place], [place] [address]    Special Requests   Final    BOTTLES DRAWN AEROBIC AND ANAEROBIC Blood Culture results may not be optimal due to an inadequate volume of blood received in culture bottles Performed at [facility] [address], [place], [place] [address]    Culture PENDING  Incomplete   Report Status PENDING  Incomplete  Blood culture (routine x 2)     Status: None (Preliminary result)   Collection Time: 12/31/19  7:32 PM   Specimen: BLOOD LEFT WRIST  Result Value Ref Range Status   Specimen Description   Final    BLOOD LEFT WRIST Performed at [facility], [address], [place], [place] [address]    Special Requests   Final    BOTTLES DRAWN AEROBIC AND ANAEROBIC Blood Culture results may not be optimal due to an inadequate volume of blood received in culture bottles Performed at [facility] [address], [place], [place] [address]    Culture PENDING  Incomplete   Report Status PENDING  Incomplete         Radiology Studies: CT Head Wo Contrast  Result Date: 12/31/2019 CLINICAL DATA:  Recent syncopal episode EXAM: CT HEAD WITHOUT CONTRAST TECHNIQUE: Contiguous axial images were obtained from the base of the skull through the vertex without intravenous contrast. COMPARISON:  12/12/2019 FINDINGS: Brain: Mild atrophic changes and chronic white matter ischemic changes are seen. These are similar to that noted on the prior CT examination. Multiple prior lacunar infarcts are seen and stable. No findings to suggest acute hemorrhage, acute infarction or space-occupying mass lesion are noted. Vascular: No hyperdense vessel or unexpected calcification. Skull: Normal.  Negative for fracture or focal lesion. Sinuses/Orbits: No acute finding. Other: None. IMPRESSION: Chronic atrophic and ischemic changes similar to that noted on the prior exam. No acute abnormality is noted. Electronically Signed   By: Inez Catalina M.D.   On: 12/31/2019 19:00   DG Chest Port 1 View  Result Date: 12/31/2019 CLINICAL DATA:  Syncopal episode. EXAM: PORTABLE CHEST 1 VIEW COMPARISON:  December 12, 2019 FINDINGS: The lungs are hyperinflated. Mild, chronic appearing increased lung markings are seen. There is no evidence of acute infiltrate, pleural effusion or pneumothorax. The heart size and mediastinal contours are within normal limits. There is mild calcification of the aortic arch. The visualized skeletal structures are unremarkable. IMPRESSION: No active disease. Electronically Signed   By: Virgina Norfolk M.D.   On: 12/31/2019 18:30        Scheduled Meds: . donepezil  10 mg Oral QHS  . feeding supplement (ENSURE ENLIVE)  237 mL Oral BID BM  .  gabapentin  300 mg Oral TID  . [START ON 01/02/2020] influenza vaccine adjuvanted  0.5 mL Intramuscular Tomorrow-1000  . levothyroxine  75 mcg Oral Daily  . memantine  5 mg Oral BID  . multivitamin with minerals  1 tablet Oral Daily  . [START ON 01/02/2020] pneumococcal 23 valent vaccine  0.5 mL Intramuscular Tomorrow-1000  . sertraline  50 mg Oral Daily  . sodium chloride flush  3 mL Intravenous Q12H   Continuous Infusions: . cefTRIAXone (ROCEPHIN)  IV Stopped (01/01/20 0040)     LOS: 0 days    Time spent: 36 minutes spent on chart review, discussion with nursing staff, consultants, updating family and interview/physical exam; more than 50% of that time was spent in counseling and/or coordination of care.    Alvira Philips [place], DO [facility] Available via Epic secure chat 7am-7pm After these hours, please refer to coverage provider listed on [email or web address] 01/01/2020, 9:35 AM

## 2020-01-01 NOTE — Progress Notes (Signed)
Called son Maisie Fus, discussed plan of care.  Son is concerned with the patients decrease in oral intake and would like for nursing staff to encourage eating as often as possible. Son would also like for staff to help keep patient calm.  Patient has alzheimer disease and is easily confused.  Patient was able to state her name and date of birth.  She was aware she was at a hospital. Patient resting comfortably with extra heated blankets and call bell within reach.

## 2020-01-01 NOTE — Progress Notes (Signed)
Occupational Therapy Evaluation Patient Details Name: Amanda Sanford MRN: [id number] DOB: [birth year] Today's Date: 01/01/2020    History of Present Illness 86 y.o. female with medical history significant for advanced Alzheimer's dementia, chronic diastolic heart failure, acquired hypothyroidism, hypertension, and recent COVID-19 infection, who is admitted to [facility] on 12/31/2019 for further evaluation and management of syncope . The patient was recently hospitalized at [facility] for COVID-19 pneumonia from 12/12/2019 to 12/16/2018, with COVID-19 PCR found to be positive on 12/12/2019.     Clinical Impression   PTA, pt lived with her son, his wife and her husband. Pt was independent with mobility and ADL with family was assisting with IADL tasks. Son reports his mother has had very poor PO intake over the last several months and became very weak over the last week.Son also states that his mother does not a have an interest in doing any activities she previously enjoyed.  Session limited by hypotension. Son prefers for his Mom to DC home with [facility] services. Will follow acutely to facilitate [place] home. Pt will need 24/7 assistance at DC. Recommend palliative care consult.     Follow Up Recommendations  Home health OT;Supervision/Assistance - 24 hour ; [facility] Aide   Equipment Recommendations  3 in 1 bedside commode    Recommendations for Other Services PT consult; Palliative Care Consult     Precautions / Restrictions Precautions Precautions: Fall Precaution Comments: desats with activity - on RA Restrictions Weight Bearing Restrictions: No      Mobility Bed Mobility Overal bed mobility: Needs Assistance Bed Mobility: Supine to Sit;Rolling Rolling: Supervision   Supine to sit: Min guard     General bed mobility comments: dizzy once EOB  Transfers Overall transfer level: Needs assistance   Transfers: Sit to/from Stand Sit to Stand: Min assist         General  transfer comment: returned to ed due to orthostasis    Balance Overall balance assessment: Needs assistance   Sitting balance-Leahy Scale: Fair       Standing balance-Leahy Scale: Poor                             ADL either performed or assessed with clinical judgement   ADL Overall ADL's : Needs assistance/impaired Eating/Feeding: Set up Eating/Feeding Details (indicate cue type and reason): poor PO intake     Upper Body Bathing: Set up;Supervision/ safety;Bed level   Lower Body Bathing: Moderate assistance;Sitting/lateral leans   Upper Body Dressing : Minimal assistance;Sitting;Bed level   Lower Body Dressing: Moderate assistance;Sitting/lateral leans;Sit to/from stand   Toilet Transfer: Minimal assistance;Stand-pivot(per nsg)   Toileting- Clothing Manipulation and Hygiene: Moderate assistance       Functional mobility during ADLs: Minimal assistance(limited moblity)       Vision Baseline Vision/History: Wears glasses(son states all the time but unsure)       Perception     Praxis      Pertinent Vitals/Pain Pain Assessment: Faces Faces Pain Scale: Hurts a little bit Pain Location: general discomfort Pain Descriptors / Indicators: Discomfort Pain Intervention(s): Limited activity within patient's tolerance     Hand Dominance Right   Extremity/Trunk Assessment Upper Extremity Assessment Upper Extremity Assessment: Generalized weakness   Lower Extremity Assessment Lower Extremity Assessment: Defer to PT evaluation   Cervical / Trunk Assessment Cervical / Trunk Assessment: Normal   Communication Communication Communication: No difficulties   Cognition Arousal/Alertness: Lethargic Behavior During Therapy: Restless  Overall Cognitive Status: History of cognitive impairments - at baseline                                 General Comments: Pt following commands; oriented to self only; at baseline is not oriented to time;  Cognition may be more impaired due to unfamiliar environemtn - will further assess   General Comments       Exercises     Shoulder Instructions      Home Living Family/patient expects to be discharged to:: Private residence Living Arrangements: Spouse/significant other;Children Available Help at Discharge: Available 24 hours/day Type of Home: House Home Access: Stairs to enter [employer] of Steps: 4 Entrance Stairs-Rails: Right Home Layout: Two level;Bed/bath upstairs Alternate Level Stairs-Number of Steps: chair lift   Bathroom Shower/Tub: Tub/shower unit;Curtain   Bathroom Toilet: Standard Bathroom Accessibility: Yes How Accessible: Accessible via walker Home Equipment: Shower seat;Grab bars - tub/shower;Grab bars - toilet;Walker - 2 wheels;Walker - 4 wheels;Cane - single point;Wheelchair - manual          Prior Functioning/Environment Level of Independence: Needs assistance  Gait / Transfers Assistance Needed: only started using a RW this past week due to being dizzy and weak ADL's / Homemaking Assistance Needed: could do her own bathing and dressing   Comments: decline over past 3 months; son states she doesn't do anything; poor appetite; recent fall in the bathroom after syncopal event        OT Problem List: Decreased strength;Decreased activity tolerance;Impaired balance (sitting and/or standing);Decreased cognition;Decreased safety awareness;Decreased knowledge of use of DME or AE;Cardiopulmonary status limiting activity;Pain      OT Treatment/Interventions: Self-care/ADL training;Therapeutic exercise;Neuromuscular education;Energy conservation;DME and/or AE instruction;Therapeutic activities;Cognitive remediation/compensation;Patient/family education;Balance training    OT Goals(Current goals can be found in the care plan section) Acute Rehab OT Goals Patient Stated Goal: per son for pt to return home OT Goal Formulation: With patient/family Time  For Goal Achievement: 01/15/20 Potential to Achieve Goals: Good  OT Frequency: Min 3X/week   Barriers to D/C:            Co-evaluation              AM-PAC OT "6 Clicks" Daily Activity     Outcome Measure Help from another person eating meals?: A Little Help from another person taking care of personal grooming?: A Little Help from another person toileting, which includes using toliet, bedpan, or urinal?: A Lot Help from another person bathing (including washing, rinsing, drying)?: A Lot Help from another person to put on and taking off regular upper body clothing?: A Little Help from another person to put on and taking off regular lower body clothing?: A Lot 6 Click Score: 15   End of Session Nurse Communication: Mobility status  Activity Tolerance: Treatment limited secondary to medical complications (Comment)(low BP/orthostatic) Patient left: in bed;with call bell/phone within reach;with bed alarm set  OT Visit Diagnosis: Unsteadiness on feet (R26.81);Muscle weakness (generalized) (M62.81);Other symptoms and signs involving cognitive function;Pain Pain - part of body: (general discomfort)                Time: 1010-1030 OT Time Calculation (min): 20 min Charges:  OT General Charges $OT Visit: 1 Visit OT Evaluation $OT Eval Moderate Complexity: 1 Mod  [place], OT/L   Acute OT Clinical Specialist Acute Rehabilitation Services Pager [phone] Office [phone]   [facility] 01/01/2020, 10:52 AM

## 2020-01-01 NOTE — Progress Notes (Signed)
Pts lying BP is 101/51. Pts sitting is BP is 80/50. Pts BP standing is 59/38 and dizzy at this point. Doctor [place] Eric MD notified.

## 2020-01-01 NOTE — ED Notes (Signed)
Amanda Sanford (son) [phone] Family updated

## 2020-01-01 NOTE — Plan of Care (Signed)
Went over Plan of Care with pt. All questions asked and all questions answered.

## 2020-01-02 LAB — CBC
HCT: 27 % — ABNORMAL LOW (ref 36.0–46.0)
Hemoglobin: 8.8 g/dL — ABNORMAL LOW (ref 12.0–15.0)
MCH: 30.3 pg (ref 26.0–34.0)
MCHC: 32.6 g/dL (ref 30.0–36.0)
MCV: 93.1 fL (ref 80.0–100.0)
Platelets: 291 10*3/uL (ref 150–400)
RBC: 2.9 MIL/uL — ABNORMAL LOW (ref 3.87–5.11)
RDW: 13.2 % (ref 11.5–15.5)
WBC: 13.3 10*3/uL — ABNORMAL HIGH (ref 4.0–10.5)
nRBC: 0 % (ref 0.0–0.2)

## 2020-01-02 LAB — BASIC METABOLIC PANEL
Anion gap: 14 (ref 5–15)
BUN: 24 mg/dL — ABNORMAL HIGH (ref 8–23)
CO2: 22 mmol/L (ref 22–32)
Calcium: 8.4 mg/dL — ABNORMAL LOW (ref 8.9–10.3)
Chloride: 102 mmol/L (ref 98–111)
Creatinine, Ser: 0.95 mg/dL (ref 0.44–1.00)
GFR calc Af Amer: >60 mL/min (ref >60)
GFR calc non Af Amer: 55 mL/min — ABNORMAL LOW (ref >60)
Glucose, Bld: 93 mg/dL (ref 70–99)
Potassium: 4.2 mmol/L (ref 3.5–5.1)
Sodium: 138 mmol/L (ref 135–145)

## 2020-01-02 MED ORDER — MEGESTROL ACETATE 40 MG PO TABS
40.0000 mg | ORAL_TABLET | Freq: Every day | ORAL | Status: DC
  Administered 2020-01-02 – 2020-01-04 (×3): 40 mg via ORAL
  Filled 2020-01-02 (×4): qty 1

## 2020-01-02 MED ORDER — ENOXAPARIN SODIUM 40 MG/0.4ML ~~LOC~~ SOLN
40.0000 mg | Freq: Every day | SUBCUTANEOUS | Status: DC
  Administered 2020-01-02 – 2020-01-04 (×3): 40 mg via SUBCUTANEOUS
  Filled 2020-01-02 (×3): qty 0.4

## 2020-01-02 NOTE — Progress Notes (Signed)
PROGRESS NOTE    Amanda Sanford  OEU:[id number] DOB: 1932-02-02 DOA: 12/31/2019 PCP: Ward, Marcelline Deist, PA-C    Brief Narrative:   Amanda Sanford is an 86 year old Caucasian female with past medical history significant for advanced Alzheimer's dementia, chronic diastolic congestive heart failure, hypothyroidism, HTN, and recent Covid-19 viral pneumonia who initially presented to [facility] emergency department following syncopal episode.  Family reported that patient was assisted to the bathroom and on her attempt to rise from the seated position that she lost consciousness and fell forward to the bathroom floor.  Event was witnessed by patient's family and no reports of tonic-clonic activity or loss of bowel/bladder function/tongue biting.  Recently hospitalized from 12/12/2019 through 1/212021 with Covid-19 viral pneumonia completed 5-day course of remdesivir.  In the ED, patient was afebrile, heart rate range 59-78, with BP 105-58-156/65.  RR 14, SPO2 97% on room air.  Sodium 134, potassium 4.5, BUN 24, creatinine 1.04, glucose 118.  Troponin 4 and 3.  Urinalysis hazy with 21-50 WBCs, small leukoesterase, negative nitrite.  WBC count 17.6, hemoglobin 11.4, lactic acid 2.1.  EKG with NSR, RR 62, no concerning ST elevation/depression or T wave inversions.  Chest x-ray without evidence of acute cardiopulmonary disease process.  CT head no acute intracranial process.  Patient was given 1 L NS and transferred to [facility] to continue airborne/contact isolation given the less than 21 days post Covid diagnosis and further work-up and evaluation of syncope likely related to vasovagal versus orthostasis.   Assessment & Plan:   Principal Problem:   Syncope Active Problems:   Alzheimer disease (HCC)   Acute lower UTI   Leukocytosis   Dehydration   HTN (hypertension)  Syncope Etiology likely secondary to vasovagal versus orthostasis.  Patient was getting up from using the restroom during the episode.   This is likely complicated by her continued use of antihypertensives with amlodipine and lisinopril; in addition to her underlying advanced Alzheimer's dementia with likely autonomic dysfunction.  CT head without acute intracranial disease process.  --continue IV fluid hydration with NS at 75 mL's per hour --Continue to hold home antihypertensives lisinopril/amlodipine --Continue to monitor on telemetry for any concerning arrhythmias --Supportive care --Daily orthostatic vital signs, today SBP drop 171-->152-->141 lying to sit to stand --Continue therapy efforts w/ PT/OT  Acute cystitis without hematuria Urinalysis with small leukoesterase, negative nitrite, 21-50 WBCs. --Blood cultures x2: NG x 2 days --Urine culture: Pending --Continue empiric antibiotics with ceftriaxone 1 g IV every 24 hours  Recent Covid-19 viral pneumonia Hospitalized from 12/12/2019 through 12/17/2019; completed 5-day course of remdesivir.  Currently oxygenating well on room air.  Chest x-ray with no acute cardiopulmonary process. --Continue supportive care  Advanced Alzheimer's dementia --Continue donepezil 10 mg p.o. nightly --Continue memantine 5 mg p.o. twice daily --Would benefit from palliative care to follow outpatient  Depression: Continue sertraline 50 mg p.o. daily  Hypothyroidism --Continue levothyroxine 75 mcg p.o. daily  Essential hypertension Significantly orthostatic with systolic BP from a lying to standing change from a 101 to 59. --Continue to hold home antihypertensives --Continue IV fluid hydration --Continue close monitoring of BP  Chronic diastolic congestive heart failure, compensated TTE 12/13/2019 with LVEF 55-60%, no LVH, no focal wall motion abnormalities, grade 1 diastolic dysfunction and mild/moderate aortic regurgitation with mild/moderate TR. --Continue to monitor strict I's and O's and daily weights especially in the setting of IV fluid hydration as above  Adult failure to  thrive Etiology likely secondary to advanced age and advanced Alzheimer's dementia,  which has been progressing per family's report. --started Remeron and megace for appetite stimulation --Nutrition following, liberalize diet  DVT prophylaxis: SCDs, Lovenox Code Status: DNR, discussed with patient's son, Amanda Sanford on 2/6. Family Communication: Updated patient's son, Amanda Sanford via telephone this morning, after further discussion with family, agrees to change patient's CODE STATUS to DNR. Disposition Plan: Anticipate discharge back home once orthostasis improved with home health PT/OT/aide.  Also recommend palliative care to follow outpatient.  Family not interested in hospice at this time.   Consultants:   None  Procedures:   None  Antimicrobials:   Ceftriaxone 2/4>>   Subjective: Patient seen and examined bedside, resting comfortably in bed.  Sleeping but easily aroused.not hungry this morning.  No other specific complaints this morning.  Patient denies headache, no visual changes, no chest pain, no palpitations, no nausea/vomiting/diarrhea, no shortness of breath, no abdominal pain, no fatigue.  No other acute events overnight per nursing staff.  Updated patient's son, Amanda Sanford via telephone this morning, after further discussion with other family members, requests change CODE STATUS to DNR.  Family not ready for hospice at this point.  Objective: Vitals:   01/02/20 0740 01/02/20 1000 01/02/20 1001 01/02/20 1003  BP: 137/66 (!) 171/69 (!) 152/78 (!) 141/70  Pulse: 72     Resp: 18     Temp: 97.9 F (36.6 C)     TempSrc: Oral     SpO2: 97%     Weight:      Height:        Intake/Output Summary (Last 24 hours) at 01/02/2020 1126 Last data filed at 01/01/2020 1500 Gross per 24 hour  Intake 199.81 ml  Output --  Net 199.81 ml   Filed Weights   01/01/20 0217 01/01/20 0304 01/02/20 0500  Weight: 45.4 kg 45.4 kg 51.6 kg    Examination:  General exam: Appears calm and comfortable,  pleasantly confused Respiratory system: Clear to auscultation. Respiratory effort normal.  Oxygenating well on room air Cardiovascular system: S1 & S2 heard, RRR. No JVD, murmurs, rubs, gallops or clicks. No pedal edema. Gastrointestinal system: Abdomen is nondistended, soft and nontender. No organomegaly or masses felt. Normal bowel sounds heard. Central nervous system: Alert. No focal neurological deficits. Extremities: Symmetric 5 x 5 power. Skin: No rashes, lesions or ulcers Psychiatry: Judgement and insight appear poor. Mood & affect appropriate.     Data Reviewed: I have personally reviewed following labs and imaging studies  CBC: Recent Labs  Lab 12/31/19 1851 01/01/20 0330 01/02/20 0302  WBC 17.6* 14.0* 13.3*  NEUTROABS 15.6* 11.4*  --   HGB 11.4* 11.2* 8.8*  HCT 35.2* 34.5* 27.0*  MCV 93.4 92.2 93.1  PLT 298 300 628   Basic Metabolic Panel: Recent Labs  Lab 12/31/19 1851 12/31/19 1932 01/01/20 0330 01/02/20 0302  NA 134*  --  139 138  K 4.5  --  4.6 4.2  CL 99  --  106 102  CO2 27  --  25 22  GLUCOSE 118*  --  97 93  BUN 24*  --  18 24*  CREATININE 1.04*  --  0.80 0.95  CALCIUM 9.3  --  8.9 8.4*  MG  --  2.1 2.2  --    GFR: Estimated Creatinine Clearance: 35.3 mL/min (by C-G formula based on SCr of 0.95 mg/dL). Liver Function Tests: Recent Labs  Lab 12/31/19 1851 01/01/20 0330  AST 19 18  ALT 20 19  ALKPHOS 93 92  BILITOT 0.5 0.4  PROT  7.3 6.8  ALBUMIN 3.4* 3.0*   No results for input(s): LIPASE, AMYLASE in the last 168 hours. No results for input(s): AMMONIA in the last 168 hours. Coagulation Profile: No results for input(s): INR, PROTIME in the last 168 hours. Cardiac Enzymes: No results for input(s): CKTOTAL, CKMB, CKMBINDEX, TROPONINI in the last 168 hours. BNP (last 3 results) No results for input(s): PROBNP in the last 8760 hours. HbA1C: No results for input(s): HGBA1C in the last 72 hours. CBG: Recent Labs  Lab 12/31/19 1806    GLUCAP 130*   Lipid Profile: No results for input(s): CHOL, HDL, LDLCALC, TRIG, CHOLHDL, LDLDIRECT in the last 72 hours. Thyroid Function Tests: No results for input(s): TSH, T4TOTAL, FREET4, T3FREE, THYROIDAB in the last 72 hours. Anemia Panel: No results for input(s): VITAMINB12, FOLATE, FERRITIN, TIBC, IRON, RETICCTPCT in the last 72 hours. Sepsis Labs: Recent Labs  Lab 12/31/19 1931 12/31/19 2112  LATICACIDVEN 2.1* 1.4    Recent Results (from the past 240 hour(s))  Urine culture     Status: None (Preliminary result)   Collection Time: 12/31/19  7:31 PM   Specimen: Urine, Clean Catch  Result Value Ref Range Status   Specimen Description   Final    URINE, CLEAN CATCH Performed at [facility], [address] [place], [place] [address]    Special Requests   Final    NONE Performed at [facility], [address] [place], [place] [address]    Culture   Final    CULTURE REINCUBATED FOR BETTER GROWTH Performed at [facility], [address], [place], [place] [address]    Report Status PENDING  Incomplete  Blood culture (routine x 2)     Status: None (Preliminary result)   Collection Time: 12/31/19  7:31 PM   Specimen: BLOOD RIGHT WRIST  Result Value Ref Range Status   Specimen Description   Final    BLOOD RIGHT WRIST Performed at [facility], [address], [place], [place] [address]    Special Requests   Final    BOTTLES DRAWN AEROBIC AND ANAEROBIC Blood Culture results may not be optimal due to an inadequate volume of blood received in culture bottles Performed at [facility], [address] [place], [place] [address]    Culture   Final    NO GROWTH 2 DAYS Performed at [facility], [address], [place], [place] [address]    Report Status PENDING  Incomplete  Blood culture (routine x 2)     Status: None (Preliminary result)   Collection Time: 12/31/19  7:32 PM   Specimen: BLOOD  LEFT WRIST  Result Value Ref Range Status   Specimen Description   Final    BLOOD LEFT WRIST Performed at [facility], [address] [place], [place] [address]    Special Requests   Final    BOTTLES DRAWN AEROBIC AND ANAEROBIC Blood Culture results may not be optimal due to an inadequate volume of blood received in culture bottles Performed at [facility], [address] [place], [place] [address]    Culture   Final    NO GROWTH 2 DAYS Performed at [facility], [address] [place], [place] [address]    Report Status PENDING  Incomplete         Radiology Studies: CT Head Wo Contrast  Result Date: 12/31/2019 CLINICAL DATA:  Recent syncopal episode EXAM: CT  HEAD WITHOUT CONTRAST TECHNIQUE: Contiguous axial images were obtained from the base of the skull through the vertex without intravenous contrast. COMPARISON:  12/12/2019 FINDINGS: Brain: Mild atrophic changes and chronic white matter ischemic changes are seen. These are similar to that noted on the prior CT examination. Multiple prior lacunar infarcts are seen and stable. No findings to suggest acute hemorrhage, acute infarction or space-occupying mass lesion are noted. Vascular: No hyperdense vessel or unexpected calcification. Skull: Normal. Negative for fracture or focal lesion. Sinuses/Orbits: No acute finding. Other: None. IMPRESSION: Chronic atrophic and ischemic changes similar to that noted on the prior exam. No acute abnormality is noted. Electronically Signed   By: Alcide Clever M.D.   On: 12/31/2019 19:00   DG Chest Port 1 View  Result Date: 12/31/2019 CLINICAL DATA:  Syncopal episode. EXAM: PORTABLE CHEST 1 VIEW COMPARISON:  December 12, 2019 FINDINGS: The lungs are hyperinflated. Mild, chronic appearing increased lung markings are seen. There is no evidence of acute infiltrate, pleural effusion or pneumothorax. The heart size and mediastinal contours are within normal limits. There is  mild calcification of the aortic arch. The visualized skeletal structures are unremarkable. IMPRESSION: No active disease. Electronically Signed   By: Aram Candela M.D.   On: 12/31/2019 18:30        Scheduled Meds: . donepezil  10 mg Oral QHS  . feeding supplement (ENSURE ENLIVE)  237 mL Oral TID BM  . gabapentin  300 mg Oral TID  . levothyroxine  75 mcg Oral Daily  . memantine  5 mg Oral BID  . mirtazapine  7.5 mg Oral QHS  . multivitamin with minerals  1 tablet Oral Daily  . sertraline  50 mg Oral Daily  . sodium chloride flush  3 mL Intravenous Q12H   Continuous Infusions: . sodium chloride 75 mL/hr at 01/01/20 1018  . cefTRIAXone (ROCEPHIN)  IV Stopped (01/02/20 0800)     LOS: 1 day    Time spent: 34 minutes spent on chart review, discussion with nursing staff, consultants, updating family and interview/physical exam; more than 50% of that time was spent in counseling and/or coordination of care.    Alvira Philips [place], DO [facility] Available via Epic secure chat 7am-7pm After these hours, please refer to coverage provider listed on [email or web address] 01/02/2020, 11:26 AM

## 2020-01-02 NOTE — Progress Notes (Addendum)
Spoke with patient's son, Maisie Fus. Answered all questions and explained POC. Number given to son to call directly to patient room.

## 2020-01-02 NOTE — Plan of Care (Signed)
Patient has been stable and without complication throughout shift. Appetite remains poor, but did drink ensures and had a small amount of breakfast. Up x1 assist with walker. Patient VSS. Occ confusion and anxiety noted. Call light in reach, bed alarm in place.   Problem: Education: Goal: Knowledge of risk factors and measures for prevention of condition will improve Outcome: Progressing   Problem: Coping: Goal: Psychosocial and spiritual needs will be supported Outcome: Progressing   Problem: Respiratory: Goal: Will maintain a patent airway Outcome: Progressing Goal: Complications related to the disease process, condition or treatment will be avoided or minimized Outcome: Progressing   Problem: Education: Goal: Knowledge of General Education information will improve Description: Including pain rating scale, medication(s)/side effects and non-pharmacologic comfort measures Outcome: Progressing   Problem: Health Behavior/Discharge Planning: Goal: Ability to manage health-related needs will improve Outcome: Progressing   Problem: Clinical Measurements: Goal: Ability to maintain clinical measurements within normal limits will improve Outcome: Progressing Goal: Will remain free from infection Outcome: Progressing Goal: Diagnostic test results will improve Outcome: Progressing Goal: Respiratory complications will improve Outcome: Progressing Goal: Cardiovascular complication will be avoided Outcome: Progressing   Problem: Activity: Goal: Risk for activity intolerance will decrease Outcome: Progressing   Problem: Nutrition: Goal: Adequate nutrition will be maintained Outcome: Progressing   Problem: Coping: Goal: Level of anxiety will decrease Outcome: Progressing   Problem: Elimination: Goal: Will not experience complications related to bowel motility Outcome: Progressing Goal: Will not experience complications related to urinary retention Outcome: Progressing    Problem: Pain Managment: Goal: General experience of comfort will improve Outcome: Progressing   Problem: Safety: Goal: Ability to remain free from injury will improve Outcome: Progressing   Problem: Skin Integrity: Goal: Risk for impaired skin integrity will decrease Outcome: Progressing

## 2020-01-02 NOTE — Progress Notes (Signed)
Patient alert and verbal. Confusion noted. Patient noted to be anxious earlier in the shift. The patient stated she does not like to be away from her husband. Patient assisted to the [facility]. Denies pain or discomfort.

## 2020-01-02 NOTE — Progress Notes (Signed)
ANTICOAGULATION CONSULT NOTE - Initial Consult  Pharmacy Consult for Lovenox Indication: VTE prophylaxis  No Known Allergies  Patient Measurements: Height: 5\' 3"  (160 cm) Weight: 113 lb 11.2 oz (51.6 kg) IBW/kg (Calculated) : 52.4  Vital Signs: Temp: 97.9 F (36.6 C) (02/06 0740) Temp Source: Oral (02/06 0740) BP: 141/70 (02/06 1003) Pulse Rate: 72 (02/06 0740)  Labs: Recent Labs    12/31/19 1851 12/31/19 1851 12/31/19 1932 01/01/20 0330 01/02/20 0302  HGB 11.4*   < >  --  11.2* 8.8*  HCT 35.2*  --   --  34.5* 27.0*  PLT 298  --   --  300 291  CREATININE 1.04*  --   --  0.80 0.95  TROPONINIHS 4  --  3  --   --    < > = values in this interval not displayed.    Estimated Creatinine Clearance: 35.3 mL/min (by C-G formula based on SCr of 0.95 mg/dL).   Medical History: Past Medical History:  Diagnosis Date  . Alzheimer's disease (HCC)   . Benign essential HTN   . Chronic diastolic heart failure (HCC)   . Hypothyroid     Medications:  Medications Prior to Admission  Medication Sig Dispense Refill Last Dose  . amLODipine (NORVASC) 5 MG tablet Take 1 tablet (5 mg total) by mouth daily. 30 tablet 0 12/31/2019 at Unknown time  . ascorbic acid (VITAMIN C) 500 MG tablet Take 1 tablet (500 mg total) by mouth daily. 30 tablet 0 12/30/2019 at Unknown time  . cyanocobalamin (,VITAMIN B-12,) 1000 MCG/ML injection Inject 1 mL into the muscle every 7 (seven) days.   Past Month at Unknown time  . donepezil (ARICEPT) 10 MG tablet Take 10 mg by mouth at bedtime.   12/30/2019 at Unknown time  . feeding supplement, ENSURE ENLIVE, (ENSURE ENLIVE) LIQD Take 237 mLs by mouth 2 (two) times daily between meals. 237 mL 12 12/30/2019 at Unknown time  . folic acid (FOLVITE) 1 MG tablet Take 1 tablet (1 mg total) by mouth daily. 30 tablet 0 12/30/2019 at Unknown time  . gabapentin (NEURONTIN) 300 MG capsule Take 300 mg by mouth 3 (three) times daily.   12/30/2019 at Unknown time  . levothyroxine  (SYNTHROID) 75 MCG tablet Take 75 mcg by mouth daily.   12/31/2019 at Unknown time  . lisinopril (ZESTRIL) 5 MG tablet Take 1 tablet (5 mg total) by mouth daily. 30 tablet 0 12/30/2019 at Unknown time  . memantine (NAMENDA) 5 MG tablet Take 5 mg by mouth 2 (two) times daily. AM and PM   12/30/2019 at Unknown time  . Multiple Vitamin (MULTIVITAMIN WITH MINERALS) TABS tablet Take 1 tablet by mouth daily. 30 tablet 0 12/31/2019 at Unknown time  . sertraline (ZOLOFT) 50 MG tablet Take 50 mg by mouth daily.   12/30/2019 at Unknown time  . zinc sulfate 220 (50 Zn) MG capsule Take 1 capsule (220 mg total) by mouth daily. 30 capsule 0 12/30/2019 at Unknown time    Assessment: 26 YOF with COVID-19. Pharmacy consulted to start Lovenox for VTE prophylaxis. SCR wnl, D-Dimer < 5. H/H down, Plt wnl   Goal of Therapy:  VTE prophylaxis Monitor platelets by anticoagulation protocol: Yes   Plan:  -Start Lovenox 40 mg daily -Pharmacy to sign off but will monitor peripherally for dosage adjustments   83, PharmD., BCPS Clinical Pharmacist Clinical phone for 01/02/20 until 5pm: [phone]

## 2020-01-02 NOTE — Progress Notes (Signed)
Patient moved rooms d/t attempting to get out of bed. Notified son, Maisie Fus, of room change.

## 2020-01-03 LAB — CBC
HCT: 30.7 % — ABNORMAL LOW (ref 36.0–46.0)
Hemoglobin: 9.7 g/dL — ABNORMAL LOW (ref 12.0–15.0)
MCH: 29.5 pg (ref 26.0–34.0)
MCHC: 31.6 g/dL (ref 30.0–36.0)
MCV: 93.3 fL (ref 80.0–100.0)
Platelets: 283 10*3/uL (ref 150–400)
RBC: 3.29 MIL/uL — ABNORMAL LOW (ref 3.87–5.11)
RDW: 13.2 % (ref 11.5–15.5)
WBC: 11.4 10*3/uL — ABNORMAL HIGH (ref 4.0–10.5)
nRBC: 0 % (ref 0.0–0.2)

## 2020-01-03 LAB — URINE CULTURE: Culture: >=100000 — AB

## 2020-01-03 LAB — BASIC METABOLIC PANEL
Anion gap: 8 (ref 5–15)
BUN: 25 mg/dL — ABNORMAL HIGH (ref 8–23)
CO2: 26 mmol/L (ref 22–32)
Calcium: 8.6 mg/dL — ABNORMAL LOW (ref 8.9–10.3)
Chloride: 104 mmol/L (ref 98–111)
Creatinine, Ser: 0.88 mg/dL (ref 0.44–1.00)
GFR calc Af Amer: >60 mL/min (ref >60)
GFR calc non Af Amer: 60 mL/min — ABNORMAL LOW (ref >60)
Glucose, Bld: 105 mg/dL — ABNORMAL HIGH (ref 70–99)
Potassium: 4.3 mmol/L (ref 3.5–5.1)
Sodium: 138 mmol/L (ref 135–145)

## 2020-01-03 LAB — MAGNESIUM: Magnesium: 2.1 mg/dL (ref 1.7–2.4)

## 2020-01-03 MED ORDER — CEPHALEXIN 250 MG PO CAPS
250.0000 mg | ORAL_CAPSULE | Freq: Three times a day (TID) | ORAL | Status: DC
  Administered 2020-01-03 – 2020-01-04 (×4): 250 mg via ORAL
  Filled 2020-01-03 (×5): qty 1

## 2020-01-03 NOTE — Progress Notes (Signed)
PROGRESS NOTE    Amanda Sanford  DVV:[id number] DOB: Feb 01, 1932 DOA: 12/31/2019 PCP: Ward, Marcelline Deist, PA-C    Brief Narrative:   Amanda Sanford is an 86 year old Caucasian female with past medical history significant for advanced Alzheimer's dementia, chronic diastolic congestive heart failure, hypothyroidism, HTN, and recent Covid-19 viral pneumonia who initially presented to [facility] emergency department following syncopal episode.  Family reported that patient was assisted to the bathroom and on her attempt to rise from the seated position that she lost consciousness and fell forward to the bathroom floor.  Event was witnessed by patient's family and no reports of tonic-clonic activity or loss of bowel/bladder function/tongue biting.  Recently hospitalized from 12/12/2019 through 1/212021 with Covid-19 viral pneumonia completed 5-day course of remdesivir.  In the ED, patient was afebrile, heart rate range 59-78, with BP 105-58-156/65.  RR 14, SPO2 97% on room air.  Sodium 134, potassium 4.5, BUN 24, creatinine 1.04, glucose 118.  Troponin 4 and 3.  Urinalysis hazy with 21-50 WBCs, small leukoesterase, negative nitrite.  WBC count 17.6, hemoglobin 11.4, lactic acid 2.1.  EKG with NSR, RR 62, no concerning ST elevation/depression or T wave inversions.  Chest x-ray without evidence of acute cardiopulmonary disease process.  CT head no acute intracranial process.  Patient was given 1 L NS and transferred to [facility] to continue airborne/contact isolation given the less than 21 days post Covid diagnosis and further work-up and evaluation of syncope likely related to vasovagal versus orthostasis.   Assessment & Plan:   Principal Problem:   Syncope Active Problems:   Alzheimer disease (HCC)   Acute lower UTI   Leukocytosis   Dehydration   HTN (hypertension)  Syncope Etiology likely secondary to vasovagal versus orthostasis.  Patient was getting up from using the restroom during the episode.   This is likely complicated by her continued use of antihypertensives with amlodipine and lisinopril; in addition to her underlying advanced Alzheimer's dementia with likely autonomic dysfunction.  CT head without acute intracranial disease process.  --continue IV fluid hydration with NS at 75 mL's per hour --Continue to hold home antihypertensives lisinopril/amlodipine --Continue to monitor on telemetry for any concerning arrhythmias --Supportive care --Daily orthostatic vital signs, today SBP 129-->129-->120 lying to sit to stand --Continue therapy efforts w/ PT/OT  Staph epidermidis UTI Urinalysis with small leukoesterase, negative nitrite, 21-50 WBCs.  Urine culture with greater than 100 K Staph epidermidis. --Blood cultures x2: NG x 3 days --De-escalate antibiotics, change ceftriaxone to Keflex, to complete additional 1 day  Recent Covid-19 viral pneumonia Hospitalized from 12/12/2019 through 12/17/2019; completed 5-day course of remdesivir.  Currently oxygenating well on room air.  Chest x-ray with no acute cardiopulmonary process. --Continue supportive care  Advanced Alzheimer's dementia --Continue donepezil 10 mg p.o. nightly --Continue memantine 5 mg p.o. twice daily --Would benefit from palliative care to follow outpatient  Depression: Continue sertraline 50 mg p.o. daily  Hypothyroidism --Continue levothyroxine 75 mcg p.o. daily  Essential hypertension Significantly orthostatic with systolic BP from a lying to standing change from a 101 to 59. --Continue to hold home antihypertensives --Continue IV fluid hydration --Continue close monitoring of BP  Chronic diastolic congestive heart failure, compensated TTE 12/13/2019 with LVEF 55-60%, no LVH, no focal wall motion abnormalities, grade 1 diastolic dysfunction and mild/moderate aortic regurgitation with mild/moderate TR. --Continue to monitor strict I's and O's and daily weights especially in the setting of IV fluid hydration  as above  Adult failure to thrive Etiology likely secondary to advanced  age and advanced Alzheimer's dementia, which has been progressing per family's report. --started Remeron and megace for appetite stimulation --Nutrition following, liberalize diet  DVT prophylaxis: SCDs, Lovenox Code Status: DNR, discussed with patient's son, Amanda Sanford on 2/6. Family Communication: Updated patient's son, Amanda Sanford via telephone this morning, after further discussion with family, agrees to change patient's CODE STATUS to DNR. Disposition Plan: Anticipate discharge back home once orthostasis improved with home health PT/OT/aide.  Also recommend palliative care to follow outpatient.  Family not interested in hospice at this time.  Anticipate discharge home in 24-48 hours.   Consultants:   None  Procedures:   None  Antimicrobials:   Ceftriaxone 2/4 - 2/6  Keflex 2/7>>   Subjective: Patient seen and examined bedside, resting comfortably in bed.  Sleeping but easily aroused.not hungry this morning.  No other specific complaints this morning.  Patient denies headache, no visual changes, no chest pain, no palpitations, no nausea/vomiting/diarrhea, no shortness of breath, no abdominal pain, no fatigue.  No other acute events overnight per nursing staff.  Updated patient's son, Amanda Sanford via telephone this afternoon.   Objective: Vitals:   01/03/20 0100 01/03/20 0500 01/03/20 0659 01/03/20 0747  BP: 108/61 123/67 127/68   Pulse: 100 75 75   Resp: 20 20 18 20   Temp: 98.6 F (37 C) 97.8 F (36.6 C) 98.4 F (36.9 C)   TempSrc: Oral Oral Tympanic   SpO2: 94% 100% 95%   Weight:      Height:        Intake/Output Summary (Last 24 hours) at 01/03/2020 1351 Last data filed at 01/03/2020 0805 Gross per 24 hour  Intake 3387 ml  Output --  Net 3387 ml   Filed Weights   01/01/20 0217 01/01/20 0304 01/02/20 0500  Weight: 45.4 kg 45.4 kg 51.6 kg    Examination:  General exam: Appears calm and comfortable,  pleasantly confused Respiratory system: Clear to auscultation. Respiratory effort normal.  Oxygenating well on room air Cardiovascular system: S1 & S2 heard, RRR. No JVD, murmurs, rubs, gallops or clicks. No pedal edema. Gastrointestinal system: Abdomen is nondistended, soft and nontender. No organomegaly or masses felt. Normal bowel sounds heard. Central nervous system: Alert. No focal neurological deficits. Extremities: Symmetric 5 x 5 power. Skin: No rashes, lesions or ulcers Psychiatry: Judgement and insight appear poor. Mood & affect appropriate.     Data Reviewed: I have personally reviewed following labs and imaging studies  CBC: Recent Labs  Lab 12/31/19 1851 01/01/20 0330 01/02/20 0302 01/03/20 0550  WBC 17.6* 14.0* 13.3* 11.4*  NEUTROABS 15.6* 11.4*  --   --   HGB 11.4* 11.2* 8.8* 9.7*  HCT 35.2* 34.5* 27.0* 30.7*  MCV 93.4 92.2 93.1 93.3  PLT 298 300 291 332   Basic Metabolic Panel: Recent Labs  Lab 12/31/19 1851 12/31/19 1932 01/01/20 0330 01/02/20 0302 01/03/20 0550  NA 134*  --  139 138 138  K 4.5  --  4.6 4.2 4.3  CL 99  --  106 102 104  CO2 27  --  25 22 26   GLUCOSE 118*  --  97 93 105*  BUN 24*  --  18 24* 25*  CREATININE 1.04*  --  0.80 0.95 0.88  CALCIUM 9.3  --  8.9 8.4* 8.6*  MG  --  2.1 2.2  --  2.1   GFR: Estimated Creatinine Clearance: 38.1 mL/min (by C-G formula based on SCr of 0.88 mg/dL). Liver Function Tests: Recent Labs  Lab 12/31/19 1851  01/01/20 0330  AST 19 18  ALT 20 19  ALKPHOS 93 92  BILITOT 0.5 0.4  PROT 7.3 6.8  ALBUMIN 3.4* 3.0*   No results for input(s): LIPASE, AMYLASE in the last 168 hours. No results for input(s): AMMONIA in the last 168 hours. Coagulation Profile: No results for input(s): INR, PROTIME in the last 168 hours. Cardiac Enzymes: No results for input(s): CKTOTAL, CKMB, CKMBINDEX, TROPONINI in the last 168 hours. BNP (last 3 results) No results for input(s): PROBNP in the last 8760  hours. HbA1C: No results for input(s): HGBA1C in the last 72 hours. CBG: Recent Labs  Lab 12/31/19 1806  GLUCAP 130*   Lipid Profile: No results for input(s): CHOL, HDL, LDLCALC, TRIG, CHOLHDL, LDLDIRECT in the last 72 hours. Thyroid Function Tests: No results for input(s): TSH, T4TOTAL, FREET4, T3FREE, THYROIDAB in the last 72 hours. Anemia Panel: No results for input(s): VITAMINB12, FOLATE, FERRITIN, TIBC, IRON, RETICCTPCT in the last 72 hours. Sepsis Labs: Recent Labs  Lab 12/31/19 1931 12/31/19 2112  LATICACIDVEN 2.1* 1.4    Recent Results (from the past 240 hour(s))  Urine culture     Status: Abnormal   Collection Time: 12/31/19  7:31 PM   Specimen: Urine, Clean Catch  Result Value Ref Range Status   Specimen Description   Final    URINE, CLEAN CATCH Performed at [facility], [address] [place], [place] [address]    Special Requests   Final    NONE Performed at [facility], [address], [place], [place] [address]    Culture >=100,000 COLONIES/mL STAPHYLOCOCCUS EPIDERMIDIS (A)  Final   Report Status 01/03/2020 FINAL  Final   Organism ID, Bacteria STAPHYLOCOCCUS EPIDERMIDIS (A)  Final      Susceptibility   Staphylococcus epidermidis - MIC*    CIPROFLOXACIN <=0.5 SENSITIVE Sensitive     GENTAMICIN <=0.5 SENSITIVE Sensitive     NITROFURANTOIN <=16 SENSITIVE Sensitive     OXACILLIN <=0.25 SENSITIVE Sensitive     TETRACYCLINE <=1 SENSITIVE Sensitive     VANCOMYCIN 1 SENSITIVE Sensitive     TRIMETH/SULFA <=10 SENSITIVE Sensitive     CLINDAMYCIN RESISTANT Resistant     RIFAMPIN <=0.5 SENSITIVE Sensitive     Inducible Clindamycin POSITIVE Resistant     * >=100,000 COLONIES/mL STAPHYLOCOCCUS EPIDERMIDIS  Blood culture (routine x 2)     Status: None (Preliminary result)   Collection Time: 12/31/19  7:31 PM   Specimen: BLOOD RIGHT WRIST  Result Value Ref Range Status   Specimen Description   Final    BLOOD RIGHT  WRIST Performed at [facility], [address], [place], [place] [address]    Special Requests   Final    BOTTLES DRAWN AEROBIC AND ANAEROBIC Blood Culture results may not be optimal due to an inadequate volume of blood received in culture bottles Performed at [facility], [address], [place], [place] [address]    Culture   Final    NO GROWTH 3 DAYS Performed at [facility], [address], [place], [place] [address]    Report Status PENDING  Incomplete  Blood culture (routine x 2)     Status: None (Preliminary result)   Collection Time: 12/31/19  7:32 PM   Specimen: BLOOD LEFT WRIST  Result Value Ref Range Status   Specimen Description   Final    BLOOD LEFT WRIST Performed at [facility], [address], [place], [place]  [address]    Special Requests   Final    BOTTLES DRAWN AEROBIC AND ANAEROBIC Blood Culture results may not be optimal due to an inadequate volume of blood received in culture bottles Performed at [facility], [address], [place], [place] [address]    Culture   Final    NO GROWTH 3 DAYS Performed at [facility], [address], [place], [place] [address]    Report Status PENDING  Incomplete         Radiology Studies: No results found.      Scheduled Meds: . cephALEXin  250 mg Oral Q8H  . donepezil  10 mg Oral QHS  . enoxaparin (LOVENOX) injection  40 mg Subcutaneous Daily  . feeding supplement (ENSURE ENLIVE)  237 mL Oral TID BM  . gabapentin  300 mg Oral TID  . levothyroxine  75 mcg Oral Daily  . megestrol  40 mg Oral Daily  . memantine  5 mg Oral BID  . mirtazapine  7.5 mg Oral QHS  . multivitamin with minerals  1 tablet Oral Daily  . sertraline  50 mg Oral Daily  . sodium chloride flush  3 mL Intravenous Q12H   Continuous Infusions:    LOS: 2 days    Time spent: 34 minutes spent on chart review, discussion with nursing staff, consultants, updating family and  interview/physical exam; more than 50% of that time was spent in counseling and/or coordination of care.    Alvira Philips [place], DO [facility] Available via Epic secure chat 7am-7pm After these hours, please refer to coverage provider listed on [email or web address] 01/03/2020, 1:51 PM

## 2020-01-03 NOTE — TOC Initial Note (Addendum)
Transition of Care [facility]) - Initial/Assessment Note    Patient Details  Name: Amanda Sanford MRN: [id number] Date of Birth: 11-30-1931  Transition of Care [facility]) CM/SW Contact:    Alberteen Sam, [place] Phone Number: [phone] 01/03/2020, 3:35 PM  Clinical Narrative:                  CSW spoke with patient's son Marcello Moores regarding discharge planning and home health recommendation, he is in agreement and reports no preference for agency. He is also in agreement wit DME 3in1, and states patient already has a walker at home and identifies no other DME needs at this time. Home address for services confirmed accurate in chart.   Son Marcello Moores would like to be point of contact for all discharge planning dn ensure home health agency patient is set up with will contact him to arrange days/times for physical therapy services.   CSW has spoke with [facility] regarding 3in[facility], will be taken from [facility].   CSW has sent out home health referrals for PT services. [facility] and [facility] have declined at this time, pending repsonses from [place].   Expected Discharge Plan: [place] Barriers to Discharge: Continued Medical Work up   Patient Goals and CMS Choice   CMS Medicare.gov Compare Post Acute Care list provided to:: Patient Represenative (must comment)(son Marcello Moores) Choice offered to / list presented to : Adult Children  Expected Discharge Plan and Services Expected Discharge Plan: [place] Choice: [place] arrangements for the past 2 months: [place]                   DME Agency: [facility] Date DME Agency Contacted: 01/03/20 Time DME Agency Contacted: [phone]   [place] Arranged: PT          Prior Living Arrangements/Services Living arrangements for the past 2 months: [place] Lives with:: Self Patient language and need for interpreter reviewed:: Yes Do you feel safe going back to the place where  you live?: Yes      Need for Family Participation in Patient Care: Yes (Comment) Care giver support system in place?: Yes (comment)   Criminal Activity/Legal Involvement Pertinent to Current Situation/Hospitalization: No - Comment as needed  Activities of Daily Living Home Assistive Devices/Equipment: None ADL Screening (condition at time of admission) Patient's cognitive ability adequate to safely complete daily activities?: No Is the patient deaf or have difficulty hearing?: No Does the patient have difficulty seeing, even when wearing glasses/contacts?: No Does the patient have difficulty concentrating, remembering, or making decisions?: Yes Patient able to express need for assistance with ADLs?: Yes Does the patient have difficulty dressing or bathing?: No Independently performs ADLs?: Yes (appropriate for developmental age) Does the patient have difficulty walking or climbing stairs?: Yes Weakness of Legs: Both Weakness of Arms/Hands: None  Permission Sought/Granted Permission sought to share information with : Case Manager, Customer service manager, Family Supports Permission granted to share information with : Yes, Verbal Permission Granted  Share Information with NAME: Marcello Moores  Permission granted to share info w AGENCY: [place] granted to share info w Relationship: son  Permission granted to share info w Contact Information: [phone]  Emotional Assessment Appearance:: Other (Comment Required(unable to assess - remote) Attitude/Demeanor/Rapport: Unable to Assess Affect (typically observed): Unable to Assess   Alcohol / Substance Use: Not Applicable Psych Involvement: No (comment)  Admission diagnosis:  Syncope [R55] AKI (acute kidney injury) (HCC) [N17.9] Syncope, unspecified syncope type [R55] Leukocytosis, unspecified type [D72.829] Patient Active Problem List   Diagnosis Date Noted  . Acute lower UTI 01/01/2020  . Leukocytosis 01/01/2020   . Dehydration 01/01/2020  . HTN (hypertension) 01/01/2020  . Syncope 12/31/2019  . Alzheimer disease (HCC) 12/16/2019  . Hypertensive urgency 12/16/2019  . Acute metabolic encephalopathy 12/13/2019  . Ambulatory dysfunction 12/13/2019  . Pneumonia due to COVID-19 virus 12/13/2019   PCP:  Ward, Marcelline Deist, PA-C Pharmacy:   [facility] - [place], [place] - [phone] [place] [address] Phone: [phone] Fax: [phone]     Social Determinants of Health (SDOH) Interventions    Readmission Risk Interventions No flowsheet data found.

## 2020-01-03 NOTE — Progress Notes (Signed)
Urine culture came back with MSSE. Ok to change ceftriaxone to Keflex to complete a total of 5d of abx per Dr. [place].  Ulyses [place], PharmD, BCIDP, AAHIVP, CPP Infectious Disease Pharmacist 01/03/2020 1:03 PM

## 2020-01-03 NOTE — Progress Notes (Signed)
Patient remains pleasantly confused. On room air. Unsteady gait and impulsive. Patient got out of bed once and  made multiple other attempts. No falls or injuries this shift. No respiratory or cardiac issues this shift. Bed alarm on, call bell within real. Poor appetite. Patient encouraged to eat. Will continue to monitor.

## 2020-01-03 NOTE — Progress Notes (Signed)
Spoke with Amanda Sanford and updated her on current condition, treatment plan and possible discharge plan.

## 2020-01-04 DIAGNOSIS — R627 Adult failure to thrive: Secondary | ICD-10-CM

## 2020-01-04 MED ORDER — CEPHALEXIN 250 MG PO CAPS: 250.0000 mg | ORAL_CAPSULE | Freq: Three times a day (TID) | ORAL | 0 refills | Status: AC

## 2020-01-04 MED ORDER — MEGESTROL ACETATE 40 MG PO TABS: 40.0000 mg | ORAL_TABLET | Freq: Every day | ORAL | 0 refills | Status: AC

## 2020-01-04 MED ORDER — MIRTAZAPINE 7.5 MG PO TABS: 7.5000 mg | ORAL_TABLET | Freq: Every day | ORAL | 0 refills | Status: AC

## 2020-01-04 NOTE — Progress Notes (Signed)
Physical Therapy Treatment Patient Details Name: Amanda Sanford MRN: [id number] DOB: 09-03-1932 Today's Date: 01/04/2020    History of Present Illness Pt is an 86 y.o. female recently admitted to [facility] with COVID-19 PNA (12/12/19-12/17/19), now admitted 12/31/19 with syncopal event witnessed by family. Etiology likely vasovagal versus orthostasis. Head CT negative for acute abnormality. PMH includes advanced Alzheimer's dementia, CHF, HTN.    PT Comments    Pt endorses (+) dizziness today while up and moving around as well as constipation.  Orhtostatic BPs checked (seated and standing as she was already up in the recliner chair).  Pt was unable to stand for 3 mins to get 3 min standing, but BPs were as follows:   01/04/20 0957  Orthostatic Sitting  BP- Sitting 113/66 (reclined in recliner chair)  Pulse- Sitting 73  Orthostatic Standing at 0 minutes  BP- Standing at 0 minutes (!) 86/59  Pulse- Standing at 0 minutes 89  Orthostatic Standing at 3 minutes  BP- Standing at 3 minutes  (unable)  Oxygen Therapy  SpO2 98 %  O2 Device Room Air   Pt remains weak and unsteady on her feet.  She would likely do better with a RW (none currently in her room) and hands on assist at discharge.  Pt reports her son will be with her and her husband and RN confirms this to be true.  PT will continue to follow acutely for safe mobility progression.    Follow Up Recommendations  Home health PT;Supervision/Assistance - 24 hour     Equipment Recommendations  None recommended by PT    Recommendations for Other Services   NA     Precautions / Restrictions Precautions Precautions: Fall;Other (comment) Precaution Comments: (+) orthostatics, (+) dizziness with standing mobility.   Restrictions Weight Bearing Restrictions: No    Mobility  Bed Mobility Overal bed mobility: Needs Assistance Bed Mobility: Supine to Sit     Supine to sit: HOB elevated;Supervision     General bed mobility comments: Pt was  OOB in the recliner chair.   Transfers Overall transfer level: Needs assistance Equipment used: 1 person hand held assist Transfers: Sit to/from Stand Sit to Stand: Min assist Stand pivot transfers: Min assist       General transfer comment: Min assist to power up and stabilize in standing for balance.  Pt reliant on UE support.   Ambulation/Gait Ambulation/Gait assistance: Min assist Gait Distance (Feet): 20 Feet Assistive device: 1 person hand held assist Gait Pattern/deviations: Step-through pattern;Staggering left;Staggering right Gait velocity: decreased   General Gait Details: Pt with staggering gait pattern.  Pt reaching with free hand for support from furniture and walls.         Balance Overall balance assessment: Needs assistance Sitting-balance support: Feet supported;Bilateral upper extremity supported Sitting balance-Leahy Scale: Good     Standing balance support: Single extremity supported;Bilateral upper extremity supported Standing balance-Leahy Scale: Poor Standing balance comment: needs external support from at least one hand support.                             Cognition Arousal/Alertness: Awake/alert Behavior During Therapy: WFL for tasks assessed/performed Overall Cognitive Status: History of cognitive impairments - at baseline                                 General Comments: h/o dementia      Exercises General Exercises -  Upper Extremity Shoulder Flexion: AROM;Both;10 reps Elbow Flexion: AROM;Both;10 reps General Exercises - Lower Extremity Ankle Circles/Pumps: AROM;Both;20 reps Long Arc Quad: AROM;Both;10 reps Heel Slides: AROM;Both;10 reps Straight Leg Raises: AROM;Both;10 reps    General Comments General comments (skin integrity, edema, etc.): O2 sats stable on RA during gait, however, seated and standing BPs were (+) orthostatic.  See vitals flow sheet for details.  RN made aware.        Pertinent  Vitals/Pain Pain Assessment: No/denies pain           PT Goals (current goals can now be found in the care plan section) Acute Rehab PT Goals Patient Stated Goal: per pt to go to her son's house where her husband is staying right now.  Progress towards PT goals: Progressing toward goals    Frequency    Min 3X/week      PT Plan Current plan remains appropriate       AM-PAC PT "6 Clicks" Mobility   Outcome Measure  Help needed turning from your back to your side while in a flat bed without using bedrails?: A Little Help needed moving from lying on your back to sitting on the side of a flat bed without using bedrails?: A Little Help needed moving to and from a bed to a chair (including a wheelchair)?: A Little Help needed standing up from a chair using your arms (e.g., wheelchair or bedside chair)?: A Little Help needed to walk in hospital room?: None Help needed climbing 3-5 steps with a railing? : None 6 Click Score: 20    End of Session   Activity Tolerance: Patient limited by fatigue Patient left: in chair;with call bell/phone within reach;with chair alarm set Nurse Communication: Mobility status;Other (comment)((+) orthostatic bps) PT Visit Diagnosis: Other abnormalities of gait and mobility (R26.89);Unsteadiness on feet (R26.81)     Time: 8185-6314 PT Time Calculation (min) (ACUTE ONLY): 26 min  Charges:  $Gait Training: 8-22 mins $Therapeutic Exercise: 8-22 mins                    Corinna Capra, PT, DPT  Acute Rehabilitation [phone] pager #[phone] office  @ [facility]: [phone]   01/04/2020, 11:27 AM

## 2020-01-04 NOTE — TOC Progression Note (Signed)
Transition of Care [facility]) - Progression Note    Patient Details  Name: Amanda Sanford MRN: [id number] Date of Birth: 08-Jul-1932  Transition of [facility]) CM/SW Contact  Armanda Heritage, RN Phone Number: 01/04/2020, 10:36 AM  Clinical Narrative:    Patient set up with [facility] for HHPT/OT/ RN/aide/ social work.  Agency rep brittany given referral. CM spoke with son, Maisie Fus, who states he will be picking patient up from hospital today. 3-in-1 previously delivered to bedside.    Expected Discharge Plan: Home w Home Health Services Barriers to Discharge: No Barriers Identified  Expected Discharge Plan and Services Expected Discharge Plan: Home w Home Health Services     Post Acute Care Choice: Home Health Living arrangements for the past 2 months: Single Family Home Expected Discharge Date: 01/04/20                 DME Agency: [employer] Date DME Agency Contacted: 01/03/20 Time DME Agency Contacted: [phone]   HH Arranged: PT HH Agency: [facility] Date HH Agency Contacted: 01/04/20 Time HH Agency Contacted: [phone] Representative spoke with at [facility] Agency: [place]   Social Determinants of Health (SDOH) Interventions    Readmission Risk Interventions No flowsheet data found.

## 2020-01-04 NOTE — TOC Progression Note (Signed)
Transition of Care [facility]) - Progression Note    Patient Details  Name: Amanda Sanford MRN: [id number] Date of Birth: February 06, 1932  Transition of Care [facility]) CM/SW Contact  Armanda Heritage, RN Phone Number: 01/04/2020, 12:57 PM  Clinical Narrative:    Cm noted TOc consult for home palliative services/ hospice. Cm spoke with son Amanda Sanford, and discussed hospice options.  Amanda Sanford questioned about hospice/pallaitve services vs HH services.  CM explained the options with both.  Thomas elects for patient to return home with [facility] at this time, and intends to see how well patient does at home prior to making any hospice decisions. Amanda Sanford is aware HH social worker will be available an can assist with hospice/ palliative set up if needed.     Expected Discharge Plan: Home w Home Health Services Barriers to Discharge: No Barriers Identified  Expected Discharge Plan and Services Expected Discharge Plan: Home w Home Health Services     Post Acute Care Choice: Home Health Living arrangements for the past 2 months: Single Family Home Expected Discharge Date: 01/04/20                 DME Agency: [employer] Date DME Agency Contacted: 01/03/20 Time DME Agency Contacted: [phone]   HH Arranged: PT HH Agency: [facility] Date HH Agency Contacted: 01/04/20 Time HH Agency Contacted: [phone] Representative spoke with at [facility] Agency: [place]   Social Determinants of Health (SDOH) Interventions    Readmission Risk Interventions No flowsheet data found.

## 2020-01-04 NOTE — Discharge Summary (Signed)
Physician Discharge Summary  Amanda Sanford:[id number] DOB: [birth year] DOA: 12/31/2019  PCP: Jason Fila, PA-C  Admit date: 12/31/2019 Discharge date: 01/04/2020  Admitted From: Home Disposition: Home  Recommendations for Outpatient Follow-up:  1. Follow up with PCP in 1-2 weeks 2. Discontinued amlodipine/lisinopril for orthostatic hypotension 3. Started on Remeron and Megace for appetite stimulation 4. Advised to continue to discuss with family regarding goals of care; given her advanced Alzheimer's dementia and adult failure to thrive, likely candidate for hospice.  Recommend palliative care to follow outpatient.  Home Health:, PT/OT/RN, home health aide, social work Equipment/Devices: 3 and 1 bedside commode, has walker at home  Discharge Condition: Stable CODE STATUS: DNR Diet recommendation: Regular diet  History of present illness:  Amanda Sanford is an 86 year old Caucasian female with past medical history significant for advanced Alzheimer's dementia, chronic diastolic congestive heart failure, hypothyroidism, HTN, and recent Covid-19 viral pneumonia who initially presented to [facility] long emergency department following syncopal episode.  Family reported that patient was assisted to the bathroom and on her attempt to rise from the seated position that she lost consciousness and fell forward to the bathroom floor.  Event was witnessed by patient's family and no reports of tonic-clonic activity or loss of bowel/bladder function/tongue biting.  Recently hospitalized from 12/12/2019 through 1/212021 with Covid-19 viral pneumonia completed 5-day course of remdesivir.  In the ED, patient was afebrile, heart rate range 59-78, with BP 105-58-156/65.  RR 14, SPO2 97% on room air.  Sodium 134, potassium 4.5, BUN 24, creatinine 1.04, glucose 118.  Troponin 4 and 3.  Urinalysis hazy with 21-50 WBCs, small leukoesterase, negative nitrite.  WBC count 17.6, hemoglobin 11.4, lactic acid 2.1.   EKG with NSR, RR 62, no concerning ST elevation/depression or T wave inversions.  Chest x-ray without evidence of acute cardiopulmonary disease process.  CT head no acute intracranial process.  Patient was given 1 L NS and transferred to [facility] to continue airborne/contact isolation given the less than 21 days post Covid diagnosis and further work-up and evaluation of syncope likely related to vasovagal versus orthostasis.  Hospital course:  Syncope Etiology likely secondary to vasovagal versus orthostasis.  Patient was getting up from using the restroom during the episode.  This is likely complicated by her continued use of antihypertensives with amlodipine and lisinopril; in addition to her underlying advanced Alzheimer's dementia with likely autonomic dysfunction.  CT head without acute intracranial disease process.  Patient was monitored on telemetry without any concerning arrhythmias during her hospital course.  Patient was started on IV fluid hydration.  Her home antihypertensives were held and ultimately discontinued.  She was initially severely orthostatic which improved following IV fluid hydration.  Likely contributing factor is her advanced Alzheimer's dementia with poor oral intake.    Staph epidermidis UTI Urinalysis with small leukoesterase, negative nitrite, 21-50 WBCs.  Urine culture with greater than 100 K Staph epidermidis.  Blood cultures remain negative to date.  Patient was initially started on ceftriaxone and deescalated to Keflex based on susceptibility testing; will continue to complete a 5-day course.  Recent Covid-19 viral pneumonia Hospitalized from 12/12/2019 through 12/17/2019; completed 5-day course of remdesivir.  Currently oxygenating well on room air.  Chest x-ray with no acute cardiopulmonary process.  Advanced Alzheimer's dementia Continue donepezil 10 mg p.o. nightly. Continue memantine 5 mg p.o. twice daily. Would benefit from palliative care to follow outpatient.   Likely hospice candidate given her adult failure to thrive, poor oral intake.  Depression: Continue sertraline  50 mg p.o. daily  Hypothyroidism Continue levothyroxine 75 mcg p.o. daily  Essential hypertension Significantly orthostatic with systolic BP from a lying to standing change from a 101 to 59.  Patient's home antitensive's with amlodipine/lisinopril were held and ultimately discontinued at time of discharge.  Chronic diastolic congestive heart failure, compensated TTE 12/13/2019 with LVEF 55-60%, no LVH, no focal wall motion abnormalities, grade 1 diastolic dysfunction and mild/moderate aortic regurgitation with mild/moderate TR.  Adult failure to thrive Etiology likely secondary to advanced age and advanced Alzheimer's dementia, which has been progressing per family's report. Started Remeron and megace for appetite stimulation.  Dietitian was consulted and recommended to liberalize diet to a regular diet.  Likely would benefit from hospice/palliative care following outpatient.  Discharge Diagnoses:  Principal Problem:   Syncope Active Problems:   Alzheimer disease (HCC)   Acute lower UTI   Leukocytosis   Dehydration   HTN (hypertension)    Discharge Instructions  Discharge Instructions    Call MD for:  difficulty breathing, headache or visual disturbances   Complete by: As directed    Call MD for:  extreme fatigue   Complete by: As directed    Call MD for:  persistant dizziness or light-headedness   Complete by: As directed    Call MD for:  persistant nausea and vomiting   Complete by: As directed    Call MD for:  temperature >100.4   Complete by: As directed    Diet - low sodium heart healthy   Complete by: As directed    Increase activity slowly   Complete by: As directed      Allergies as of 01/04/2020   No Known Allergies     Medication List    STOP taking these medications   amLODipine 5 MG tablet Commonly known as: NORVASC   lisinopril 5 MG  tablet Commonly known as: ZESTRIL     TAKE these medications   ascorbic acid 500 MG tablet Commonly known as: VITAMIN C Take 1 tablet (500 mg total) by mouth daily.   cephALEXin 250 MG capsule Commonly known as: KEFLEX Take 1 capsule (250 mg total) by mouth every 8 (eight) hours for 2 days.   cyanocobalamin 1000 MCG/ML injection Commonly known as: (VITAMIN B-12) Inject 1 mL into the muscle every 7 (seven) days.   donepezil 10 MG tablet Commonly known as: ARICEPT Take 10 mg by mouth at bedtime.   feeding supplement (ENSURE ENLIVE) Liqd Take 237 mLs by mouth 2 (two) times daily between meals.   folic acid 1 MG tablet Commonly known as: FOLVITE Take 1 tablet (1 mg total) by mouth daily.   gabapentin 300 MG capsule Commonly known as: NEURONTIN Take 300 mg by mouth 3 (three) times daily.   levothyroxine 75 MCG tablet Commonly known as: SYNTHROID Take 75 mcg by mouth daily.   megestrol 40 MG tablet Commonly known as: MEGACE Take 1 tablet (40 mg total) by mouth daily.   memantine 5 MG tablet Commonly known as: NAMENDA Take 5 mg by mouth 2 (two) times daily. AM and PM   mirtazapine 7.5 MG tablet Commonly known as: REMERON Take 1 tablet (7.5 mg total) by mouth at bedtime.   multivitamin with minerals Tabs tablet Take 1 tablet by mouth daily.   sertraline 50 MG tablet Commonly known as: ZOLOFT Take 50 mg by mouth daily.   zinc sulfate 220 (50 Zn) MG capsule Take 1 capsule (220 mg total) by mouth daily.  Durable Medical Equipment  (From admission, onward)         Start     Ordered   01/03/20 1408  For home use only DME 3 n 1  Once     01/03/20 1409         Follow-up Information    Ward, Marcelline Deist, PA-C. Call in 1 week(s).   Specialty: Cardiology Contact information: [address] [place] [address] [phone]          No Known Allergies  Consultations:  None   Procedures/Studies: CT Head Wo  Contrast  Result Date: 12/31/2019 CLINICAL DATA:  Recent syncopal episode EXAM: CT HEAD WITHOUT CONTRAST TECHNIQUE: Contiguous axial images were obtained from the base of the skull through the vertex without intravenous contrast. COMPARISON:  12/12/2019 FINDINGS: Brain: Mild atrophic changes and chronic white matter ischemic changes are seen. These are similar to that noted on the prior CT examination. Multiple prior lacunar infarcts are seen and stable. No findings to suggest acute hemorrhage, acute infarction or space-occupying mass lesion are noted. Vascular: No hyperdense vessel or unexpected calcification. Skull: Normal. Negative for fracture or focal lesion. Sinuses/Orbits: No acute finding. Other: None. IMPRESSION: Chronic atrophic and ischemic changes similar to that noted on the prior exam. No acute abnormality is noted. Electronically Signed   By: Alcide Clever M.D.   On: 12/31/2019 19:00   CT Head Wo Contrast  Result Date: 12/12/2019 CLINICAL DATA:  86 year female with altered mental status. EXAM: CT HEAD WITHOUT CONTRAST TECHNIQUE: Contiguous axial images were obtained from the base of the skull through the vertex without intravenous contrast. COMPARISON:  None. FINDINGS: Brain: There is mild age-related atrophy and moderate chronic microvascular ischemic changes. There is no acute intracranial hemorrhage. No mass effect or midline shift. No extra-axial fluid collection. Vascular: No hyperdense vessel or unexpected calcification. Skull: Normal. Negative for fracture or focal lesion. Sinuses/Orbits: No acute finding. Other: None IMPRESSION: 1. No acute intracranial hemorrhage. 2. Age-related atrophy and chronic microvascular ischemic changes. Electronically Signed   By: Elgie Collard M.D.   On: 12/12/2019 17:40   MR BRAIN WO CONTRAST  Result Date: 12/12/2019 CLINICAL DATA:  Initial evaluation for acute ataxia, stroke suspected. EXAM: MRI HEAD WITHOUT CONTRAST TECHNIQUE: Multiplanar,  multiecho pulse sequences of the brain and surrounding structures were obtained without intravenous contrast. COMPARISON:  Prior CT from earlier same day. FINDINGS: Brain: Examination technically limited as the patient was unable to tolerate the full length of the exam. Additionally, images provided are markedly degraded by motion. Generalized age-related cerebral atrophy. Patchy and confluent T2/FLAIR hyperintensity within the periventricular deep white matter most consistent with chronic small vessel ischemic disease, moderate nature. Multiple scattered superimposed remote lacunar infarcts present within the bilateral basal ganglia. Patchy small vessel changes noted within the pons as well. No abnormal foci of restricted diffusion to suggest acute or subacute ischemia. Gray-white matter differentiation maintained. No areas of remote cortical infarction. No definite evidence for acute intracranial hemorrhage. No mass lesion or midline shift. No hydrocephalus. No extra-axial fluid collection. Pituitary gland grossly normal. Midline structures intact. Vascular: Major intracranial vascular flow voids are grossly maintained at the skull base. Skull and upper cervical spine: Craniocervical junction within normal limits. Bone marrow signal intensity grossly normal. No scalp soft tissue abnormality. Sinuses/Orbits: Patient status post bilateral ocular lens replacement. Paranasal sinuses are grossly clear. No mastoid effusion. Other: None. IMPRESSION: 1. Technically limited exam due to motion artifact and the patient's inability to tolerate the full  length of the study. 2. No acute intracranial infarct or other definite abnormality. 3. Age-related cerebral atrophy with moderate chronic microvascular ischemic disease, with multiple superimposed remote lacunar infarcts within the bilateral basal ganglia. Electronically Signed   By: Rise Mu M.D.   On: 12/12/2019 22:56   DG Chest Port 1 View  Result Date:  12/31/2019 CLINICAL DATA:  Syncopal episode. EXAM: PORTABLE CHEST 1 VIEW COMPARISON:  December 12, 2019 FINDINGS: The lungs are hyperinflated. Mild, chronic appearing increased lung markings are seen. There is no evidence of acute infiltrate, pleural effusion or pneumothorax. The heart size and mediastinal contours are within normal limits. There is mild calcification of the aortic arch. The visualized skeletal structures are unremarkable. IMPRESSION: No active disease. Electronically Signed   By: Aram Candela M.D.   On: 12/31/2019 18:30   DG Chest Portable 1 View  Result Date: 12/12/2019 CLINICAL DATA:  Weakness and cough. EXAM: PORTABLE CHEST 1 VIEW COMPARISON:  None. FINDINGS: Cardiomediastinal silhouette is normal. Mediastinal contours appear intact. Calcific atherosclerotic disease of the aorta. Nodular opacities in the right upper and lower lung fields may represent pulmonary nodules, pleural scarring or focal airspace consolidation. Osseous structures are without acute abnormality. Soft tissues are grossly normal. IMPRESSION: 1. Nodular opacities in the right upper and lower lung fields may represent pulmonary nodules, pleural scarring or focal airspace consolidation. 2. Calcific atherosclerotic disease of the aorta. Electronically Signed   By: Ted Mcalpine M.D.   On: 12/12/2019 17:43   VAS US CAROTID  Result Date: 12/13/2019 Carotid Arterial Duplex Study Indications:       Covid-19, hypertensive emergency, dizziness, gait                    disturbance. Risk Factors:      Hyperlipidemia. Comparison Study:  No prior study Performing Technologist: Sherren Kerns RVS  Examination Guidelines: A complete evaluation includes B-mode imaging, spectral Doppler, color Doppler, and power Doppler as needed of all accessible portions of each vessel. Bilateral testing is considered an integral part of a complete examination. Limited examinations for reoccurring indications may be performed as noted.   Right Carotid Findings: +----------+--------+--------+--------+------------------+--------+           PSV cm/sEDV cm/sStenosisPlaque DescriptionComments +----------+--------+--------+--------+------------------+--------+ CCA Prox  52      8               homogeneous                +----------+--------+--------+--------+------------------+--------+ CCA Distal42      9               heterogenous               +----------+--------+--------+--------+------------------+--------+ ICA Prox  130     34      1-39%   calcific                   +----------+--------+--------+--------+------------------+--------+ ICA Mid   88      18                                         +----------+--------+--------+--------+------------------+--------+ ICA Distal59      20                                         +----------+--------+--------+--------+------------------+--------+  ECA       139     13                                         +----------+--------+--------+--------+------------------+--------+ +----------+--------+-------+--------+-------------------+           PSV cm/sEDV cmsDescribeArm Pressure (mmHG) +----------+--------+-------+--------+-------------------+ [id number]                                         +----------+--------+-------+--------+-------------------+ +---------+--------+--+--------+--+ VertebralPSV cm/s42EDV cm/s10 +---------+--------+--+--------+--+  Left Carotid Findings: +----------+--------+--------+--------+------------------+------------------+           PSV cm/sEDV cm/sStenosisPlaque DescriptionComments           +----------+--------+--------+--------+------------------+------------------+ CCA Prox  44      11                                intimal thickening +----------+--------+--------+--------+------------------+------------------+ CCA Distal48      12              heterogenous                          +----------+--------+--------+--------+------------------+------------------+ ICA Prox  59      14      1-39%   heterogenous                         +----------+--------+--------+--------+------------------+------------------+ ICA Distal72      22                                                   +----------+--------+--------+--------+------------------+------------------+ ECA       64      10                                                   +----------+--------+--------+--------+------------------+------------------+ +----------+--------+--------+--------+-------------------+           PSV cm/sEDV cm/sDescribeArm Pressure (mmHG) +----------+--------+--------+--------+-------------------+ [id number]                                          +----------+--------+--------+--------+-------------------+ +---------+--------+--+--------+--+ VertebralPSV cm/s56EDV cm/s15 +---------+--------+--+--------+--+  Summary: Right Carotid: Velocities in the right ICA are consistent with a 1-39% stenosis. Left Carotid: Velocities in the left ICA are consistent with a 1-39% stenosis. Vertebrals:  Bilateral vertebral arteries demonstrate antegrade flow. Subclavians: Normal flow hemodynamics were seen in bilateral subclavian              arteries. *See table(s) above for measurements and observations.  Electronically signed by Sherald Hess MD on 12/13/2019 at 3:56:38 PM.    Final    ECHOCARDIOGRAM LIMITED  Result Date: 12/13/2019   ECHOCARDIOGRAM LIMITED REPORT   Patient Name:   ANTAVIA TANDY Date of Exam: 12/13/2019 Medical Rec #:  [id number]    Height:       63.0 in Accession #:  [id number]   Weight:       97.7 lb Date of Birth:  10-17-1932    BSA:          1.43 m Patient Age:    87 years     BP:           181/77 mmHg Patient Gender: F            HR:           66 bpm. Exam Location:  Inpatient  Procedure: Limited Echo, Saline Contrast Bubble Study, Limited Color Doppler and             Cardiac Doppler Indications:    TIA 435.9  History:        Patient has no prior history of Echocardiogram examinations.  Sonographer:    Delcie Roch Referring Phys: [id number] MOHAMMAD Z KHAN IMPRESSIONS  1. Left ventricular ejection fraction, by visual estimation, is 55 to 60%. The left ventricle has normal function. There is no increased left ventricular wall thickness.  2. Left ventricular diastolic parameters are consistent with Grade I diastolic dysfunction (impaired relaxation).  3. The left ventricle has no regional wall motion abnormalities.  4. Global right ventricle has normal systolc function.The right ventricular size is normal. no increase in right ventricular wall thickness.  5. Mild mitral valve prolapse.  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 Mild mitral valve regurgitation.  7. Tricuspid valve regurgitation mild-moderate.  8. Tricuspid valve regurgitation mild-moderate.  9. Aortic valve regurgitation is mild to moderate. 10. Aortic dilatation noted. 11. There is mild dilatation of the aortic root measuring 41 mm. 12. Mildly elevated pulmonary artery systolic pressure. 13. The inferior vena cava is normal in size with greater than 50% respiratory variability, suggesting right atrial pressure of 3 mmHg. FINDINGS  Left Ventricle: Left ventricular ejection fraction, by visual estimation, is 55 to 60%. The left ventricle has normal function. The left ventricle has no regional wall motion abnormalities. The left ventricular internal cavity size was the left ventricle is normal in size. There is no increased left ventricular wall thickness. Left ventricular diastolic parameters are consistent with Grade I diastolic dysfunction (impaired relaxation). Normal left atrial pressure. Right Ventricle: The right ventricular size is normal. No increase in right ventricular wall thickness. Global RV systolic function is has normal systolic function. The tricuspid regurgitant velocity is 2.76 m/s, and with an assumed  right atrial pressure  of 3 mmHg, the estimated right ventricular systolic pressure is mildly elevated at 33.5 mmHg. Left Atrium: Left atrial size was normal in size. Right Atrium: Right atrial size was normal in size. Right atrial pressure is estimated at 3 mmHg. Mitral Valve: The mitral valve is myxomatous. There is mild late systolic prolapse of of the mitral valve. There is mild thickening of the mitral valve leaflet(s). MV Area by PHT, 2.09 cm. MV PHT, 105.27 msec. Mild mitral valve regurgitation. Tricuspid Valve: Tricuspid valve regurgitation mild-moderate. Aortic Valve: The aortic valve is tricuspid. Aortic valve regurgitation is mild to moderate. Aortic regurgitation PHT measures 500 msec. Pulmonic Valve: The pulmonic valve was grossly normal. Pulmonic valve regurgitation is trivial by color flow Doppler. Pulmonic regurgitation is trivial by color flow Doppler. Aorta: Aortic dilatation noted. There is mild dilatation of the aortic root measuring 41 mm. Venous: The inferior vena cava is normal in size with greater than 50% respiratory variability, suggesting right atrial pressure of 3 mmHg. Shunts: Agitated saline contrast was given intravenously to evaluate for intracardiac shunting. Saline contrast  bubble study was negative, with no evidence of any interatrial shunt. No atrial level shunt detected by color flow Doppler.  LEFT VENTRICLE         Normals PLAX 2D LVIDd:         4.10 cm         Diastology                 Normals LVIDs:         2.90 cm         LV e' lateral:   8.59 cm/s LV PW:         0.80 cm         LV E/e' lateral: 9.1 LV IVS:        0.80 cm         LV e' medial:    5.66 cm/s LV SV:         42 ml           LV E/e' medial:  13.9 LV SV Index:   30.15  LEFT ATRIUM         Index LA diam:    2.10 cm  AORTIC VALVE             Normals LVOT Vmax:   88.10 cm/s LVOT Vmean:  58.700 cm/s LVOT VTI:    0.190 m AI PHT:      500 msec  AORTA                 Normals Ao Root diam: 4.10 cm Ao Asc diam:  3.50 cm  MITRAL VALVE               Normals TRICUSPID VALVE             Normals MV Area (PHT): 2.09 cm            TR Peak grad:   30.5 mmHg MV PHT:        105.27 msec         TR Vmax:        276.00 cm/s MV Decel Time: 363 msec MV E velocity: 78.50 cm/s          SHUNTS                                    Systemic VTI: 0.19 m  Rachelle Hora Croitoru MD Electronically signed by Thurmon Fair MD Signature Date/Time: 12/13/2019/12:05:16 PM    Final       Subjective: Patient seen and examined at bedside, resting comfortably in bedside chair eating breakfast.  In good spirits this morning.  No specific complaints.  Denies headache, no chest pain, no palpitations, no shortness of breath, no abdominal pain.  She states she is ready for discharge home.  Discussed with patient's son, Maisie Fus via telephone this morning that she will be discharging home.  No other concerns.  No acute events overnight per nursing staff.  Discharge Exam: Vitals:   01/04/20 0500 01/04/20 0800  BP: (!) 153/79 (!) 153/77  Pulse: 70 69  Resp: 18 16  Temp: 98.4 F (36.9 C) 98.1 F (36.7 C)  SpO2: 99% 100%   Vitals:   01/03/20 1700 01/03/20 1900 01/04/20 0500 01/04/20 0800  BP: 125/70 129/63 (!) 153/79 (!) 153/77  Pulse: 73  70 69  Resp: 20 19 18 16   Temp: 98.1 F (36.7 C) 99 F (37.2 C) 98.4  F (36.9 C) 98.1 F (36.7 C)  TempSrc: Oral Oral Oral Oral  SpO2: 97% 97% 99% 100%  Weight:      Height:        General: Pt is alert, awake, not in acute distress Cardiovascular: RRR, S1/S2 +, no rubs, no gallops Respiratory: CTA bilaterally, no wheezing, no rhonchi Abdominal: Soft, NT, ND, bowel sounds + Extremities: no edema, no cyanosis    The results of significant diagnostics from this hospitalization (including imaging, microbiology, ancillary and laboratory) are listed below for reference.     Microbiology: Recent Results (from the past 240 hour(s))  Urine culture     Status: Abnormal   Collection Time: 12/31/19  7:31 PM    Specimen: Urine, Clean Catch  Result Value Ref Range Status   Specimen Description   Final    URINE, CLEAN CATCH Performed at [facility], [address], [place], [place] [address]    Special Requests   Final    NONE Performed at [facility], [address], [place], [place] [address]    Culture >=100,000 COLONIES/mL STAPHYLOCOCCUS EPIDERMIDIS (A)  Final   Report Status 01/03/2020 FINAL  Final   Organism ID, Bacteria STAPHYLOCOCCUS EPIDERMIDIS (A)  Final      Susceptibility   Staphylococcus epidermidis - MIC*    CIPROFLOXACIN <=0.5 SENSITIVE Sensitive     GENTAMICIN <=0.5 SENSITIVE Sensitive     NITROFURANTOIN <=16 SENSITIVE Sensitive     OXACILLIN <=0.25 SENSITIVE Sensitive     TETRACYCLINE <=1 SENSITIVE Sensitive     VANCOMYCIN 1 SENSITIVE Sensitive     TRIMETH/SULFA <=10 SENSITIVE Sensitive     CLINDAMYCIN RESISTANT Resistant     RIFAMPIN <=0.5 SENSITIVE Sensitive     Inducible Clindamycin POSITIVE Resistant     * >=100,000 COLONIES/mL STAPHYLOCOCCUS EPIDERMIDIS  Blood culture (routine x 2)     Status: None (Preliminary result)   Collection Time: 12/31/19  7:31 PM   Specimen: BLOOD RIGHT WRIST  Result Value Ref Range Status   Specimen Description   Final    BLOOD RIGHT WRIST Performed at [facility], [address], [place], [place] [address]    Special Requests   Final    BOTTLES DRAWN AEROBIC AND ANAEROBIC Blood Culture results may not be optimal due to an inadequate volume of blood received in culture bottles Performed at [facility], [address] [place], [place] [address]    Culture   Final    NO GROWTH 3 DAYS Performed at [facility], [address], [place], [place] [address]    Report Status PENDING  Incomplete  Blood culture (routine x 2)     Status: None (Preliminary result)   Collection Time: 12/31/19  7:32 PM   Specimen: BLOOD LEFT WRIST  Result Value Ref Range Status   Specimen  Description   Final    BLOOD LEFT WRIST Performed at [facility], [address], [place], [place] [address]    Special Requests   Final    BOTTLES DRAWN AEROBIC AND ANAEROBIC Blood Culture results may not be optimal due to an inadequate volume of blood received in culture bottles Performed at [facility], [address] [place], [place] [address]    Culture   Final    NO GROWTH 3 DAYS Performed at [facility], [address] [place], [place] [address]    Report Status PENDING  Incomplete  Labs: BNP (last 3 results) Recent Labs    12/31/19 1851  BNP 616.0*   Basic Metabolic Panel: Recent Labs  Lab 12/31/19 1851 12/31/19 1932 01/01/20 0330 01/02/20 0302 01/03/20 0550  NA 134*  --  139 138 138  K 4.5  --  4.6 4.2 4.3  CL 99  --  106 102 104  CO2 27  --  25 22 26   GLUCOSE 118*  --  97 93 105*  BUN 24*  --  18 24* 25*  CREATININE 1.04*  --  0.80 0.95 0.88  CALCIUM 9.3  --  8.9 8.4* 8.6*  MG  --  2.1 2.2  --  2.1   Liver Function Tests: Recent Labs  Lab 12/31/19 1851 01/01/20 0330  AST 19 18  ALT 20 19  ALKPHOS 93 92  BILITOT 0.5 0.4  PROT 7.3 6.8  ALBUMIN 3.4* 3.0*   No results for input(s): LIPASE, AMYLASE in the last 168 hours. No results for input(s): AMMONIA in the last 168 hours. CBC: Recent Labs  Lab 12/31/19 1851 01/01/20 0330 01/02/20 0302 01/03/20 0550  WBC 17.6* 14.0* 13.3* 11.4*  NEUTROABS 15.6* 11.4*  --   --   HGB 11.4* 11.2* 8.8* 9.7*  HCT 35.2* 34.5* 27.0* 30.7*  MCV 93.4 92.2 93.1 93.3  PLT 298 300 291 283   Cardiac Enzymes: No results for input(s): CKTOTAL, CKMB, CKMBINDEX, TROPONINI in the last 168 hours. BNP: Invalid input(s): POCBNP CBG: Recent Labs  Lab 12/31/19 1806  GLUCAP 130*   D-Dimer No results for input(s): DDIMER in the last 72 hours. Hgb A1c No results for input(s): HGBA1C in the last 72 hours. Lipid Profile No results for input(s): CHOL, HDL, LDLCALC, TRIG, CHOLHDL,  LDLDIRECT in the last 72 hours. Thyroid function studies No results for input(s): TSH, T4TOTAL, T3FREE, THYROIDAB in the last 72 hours.  Invalid input(s): FREET3 Anemia work up No results for input(s): VITAMINB12, FOLATE, FERRITIN, TIBC, IRON, RETICCTPCT in the last 72 hours. Urinalysis    Component Value Date/Time   COLORURINE YELLOW 12/31/2019 1931   APPEARANCEUR HAZY (A) 12/31/2019 1931   LABSPEC 1.010 12/31/2019 1931   PHURINE 7.0 12/31/2019 1931   GLUCOSEU NEGATIVE 12/31/2019 1931   HGBUR NEGATIVE 12/31/2019 1931   BILIRUBINUR NEGATIVE 12/31/2019 1931   KETONESUR NEGATIVE 12/31/2019 1931   PROTEINUR NEGATIVE 12/31/2019 1931   NITRITE NEGATIVE 12/31/2019 1931   LEUKOCYTESUR SMALL (A) 12/31/2019 1931   Sepsis Labs Invalid input(s): PROCALCITONIN,  WBC,  LACTICIDVEN Microbiology Recent Results (from the past 240 hour(s))  Urine culture     Status: Abnormal   Collection Time: 12/31/19  7:31 PM   Specimen: Urine, Clean Catch  Result Value Ref Range Status   Specimen Description   Final    URINE, CLEAN CATCH Performed at [facility], [place] [address], [place], [place] [address]    Special Requests   Final    NONE Performed at [facility], [place] [address] [place], [place] [address]    Culture >=100,000 COLONIES/mL STAPHYLOCOCCUS EPIDERMIDIS (A)  Final   Report Status 01/03/2020 FINAL  Final   Organism ID, Bacteria STAPHYLOCOCCUS EPIDERMIDIS (A)  Final      Susceptibility   Staphylococcus epidermidis - MIC*    CIPROFLOXACIN <=0.5 SENSITIVE Sensitive     GENTAMICIN <=0.5 SENSITIVE Sensitive     NITROFURANTOIN <=16 SENSITIVE Sensitive     OXACILLIN <=0.25 SENSITIVE Sensitive     TETRACYCLINE <=1 SENSITIVE Sensitive  VANCOMYCIN 1 SENSITIVE Sensitive     TRIMETH/SULFA <=10 SENSITIVE Sensitive     CLINDAMYCIN RESISTANT Resistant     RIFAMPIN <=0.5 SENSITIVE Sensitive     Inducible Clindamycin POSITIVE Resistant     * >=100,000  COLONIES/mL STAPHYLOCOCCUS EPIDERMIDIS  Blood culture (routine x 2)     Status: None (Preliminary result)   Collection Time: 12/31/19  7:31 PM   Specimen: BLOOD RIGHT WRIST  Result Value Ref Range Status   Specimen Description   Final    BLOOD RIGHT WRIST Performed at [facility], [address] [place], [place] [address]    Special Requests   Final    BOTTLES DRAWN AEROBIC AND ANAEROBIC Blood Culture results may not be optimal due to an inadequate volume of blood received in culture bottles Performed at [facility], [address] [place], [place] [address]    Culture   Final    NO GROWTH 3 DAYS Performed at [facility], [address] [place], [place] [address]    Report Status PENDING  Incomplete  Blood culture (routine x 2)     Status: None (Preliminary result)   Collection Time: 12/31/19  7:32 PM   Specimen: BLOOD LEFT WRIST  Result Value Ref Range Status   Specimen Description   Final    BLOOD LEFT WRIST Performed at [facility], [address] [place], [place] [address]    Special Requests   Final    BOTTLES DRAWN AEROBIC AND ANAEROBIC Blood Culture results may not be optimal due to an inadequate volume of blood received in culture bottles Performed at [facility], [address] [place], [place] [address]    Culture   Final    NO GROWTH 3 DAYS Performed at [facility], [address] [place], [place] [address]    Report Status PENDING  Incomplete     Time coordinating discharge: Over 30 minutes  SIGNED:   Alvira PhilipsEric J [place], DO  [facility] 01/04/2020, 9:49 AM

## 2020-01-04 NOTE — Progress Notes (Signed)
Called to update son about orthostatic changes, careful as she is high risk for falls, and dizziness. Also that patient is to be discharged today. Son was concerned and RN discussed hospice and palliative care. Son requested phone call from MD. MD notified.

## 2020-01-04 NOTE — Discharge Instructions (Signed)
Orthostatic Hypotension Blood pressure is a measurement of how strongly, or weakly, your blood is pressing against the walls of your arteries. Orthostatic hypotension is a sudden drop in blood pressure that happens when you quickly change positions, such as when you get up from sitting or lying down. Arteries are blood vessels that carry blood from your heart throughout your body. When blood pressure is too low, you may not get enough blood to your brain or to the rest of your organs. This can cause weakness, light-headedness, rapid heartbeat, and fainting. This can last for just a few seconds or for up to a few minutes. Orthostatic hypotension is usually not a serious problem. However, if it happens frequently or gets worse, it may be a sign of something more serious. What are the causes? This condition may be caused by:  Sudden changes in posture, such as standing up quickly after you have been sitting or lying down.  Blood loss.  Loss of body fluids (dehydration).  Heart problems.  Hormone (endocrine) problems.  Pregnancy.  Severe infection.  Lack of certain nutrients.  Severe allergic reactions (anaphylaxis).  Certain medicines, such as blood pressure medicine or medicines that make the body lose excess fluids (diuretics). Sometimes, this condition can be caused by not taking medicine as directed, such as taking too much of a certain medicine. What increases the risk? The following factors may make you more likely to develop this condition:  Age. Risk increases as you get older.  Conditions that affect the heart or the central nervous system.  Taking certain medicines, such as blood pressure medicine or diuretics.  Being pregnant. What are the signs or symptoms? Symptoms of this condition may include:  Weakness.  Light-headedness.  Dizziness.  Blurred vision.  Fatigue.  Rapid heartbeat.  Fainting, in severe cases. How is this diagnosed? This condition is  diagnosed based on:  Your medical history.  Your symptoms.  Your blood pressure measurement. Your health care provider will check your blood pressure when you are: ? Lying down. ? Sitting. ? Standing. A blood pressure reading is recorded as two numbers, such as "120 over 80" (or 120/80). The first ("top") number is called the systolic pressure. It is a measure of the pressure in your arteries as your heart beats. The second ("bottom") number is called the diastolic pressure. It is a measure of the pressure in your arteries when your heart relaxes between beats. Blood pressure is measured in a unit called mm Hg. Healthy blood pressure for most adults is 120/80. If your blood pressure is below 90/60, you may be diagnosed with hypotension. Other information or tests that may be used to diagnose orthostatic hypotension include:  Your other vital signs, such as your heart rate and temperature.  Blood tests.  Tilt table test. For this test, you will be safely secured to a table that moves you from a lying position to an upright position. Your heart rhythm and blood pressure will be monitored during the test. How is this treated? This condition may be treated by:  Changing your diet. This may involve eating more salt (sodium) or drinking more water.  Taking medicines to raise your blood pressure.  Changing the dosage of certain medicines you are taking that might be lowering your blood pressure.  Wearing compression stockings. These stockings help to prevent blood clots and reduce swelling in your legs. In some cases, you may need to go to the hospital for:  Fluid replacement. This means you will   receive fluids through an IV.  Blood replacement. This means you will receive donated blood through an IV (transfusion).  Treating an infection or heart problems, if this applies.  Monitoring. You may need to be monitored while medicines that you are taking wear off. Follow these instructions  at home: Eating and drinking   Drink enough fluid to keep your urine pale yellow.  Eat a healthy diet, and follow instructions from your health care provider about eating or drinking restrictions. A healthy diet includes: ? Fresh fruits and vegetables. ? Whole grains. ? Lean meats. ? Low-fat dairy products.  Eat extra salt only as directed. Do not add extra salt to your diet unless your health care provider told you to do that.  Eat frequent, small meals.  Avoid standing up suddenly after eating. Medicines  Take over-the-counter and prescription medicines only as told by your health care provider. ? Follow instructions from your health care provider about changing the dosage of your current medicines, if this applies. ? Do not stop or adjust any of your medicines on your own. General instructions   Wear compression stockings as told by your health care provider.  Get up slowly from lying down or sitting positions. This gives your blood pressure a chance to adjust.  Avoid hot showers and excessive heat as directed by your health care provider.  Return to your normal activities as told by your health care provider. Ask your health care provider what activities are safe for you.  Do not use any products that contain nicotine or tobacco, such as cigarettes, e-cigarettes, and chewing tobacco. If you need help quitting, ask your health care provider.  Keep all follow-up visits as told by your health care provider. This is important. Contact a health care provider if you:  Vomit.  Have diarrhea.  Have a fever for more than 2-3 days.  Feel more thirsty than usual.  Feel weak and tired. Get help right away if you:  Have chest pain.  Have a fast or irregular heartbeat.  Develop numbness in any part of your body.  Cannot move your arms or your legs.  Have trouble speaking.  Become sweaty or feel light-headed.  Faint.  Feel short of breath.  Have trouble staying  awake.  Feel confused. Summary  Orthostatic hypotension is a sudden drop in blood pressure that happens when you quickly change positions.  Orthostatic hypotension is usually not a serious problem.  It is diagnosed by having your blood pressure taken lying down, sitting, and then standing.  It may be treated by changing your diet or adjusting your medicines. This information is not intended to replace advice given to you by your health care provider. Make sure you discuss any questions you have with your health care provider. Document Revised: 05/08/2018 Document Reviewed: 05/08/2018 Elsevier Patient Education  2020 Elsevier Inc[employer] Syncope Syncope is when you pass out (faint) for a short time. It is caused by a sudden decrease in blood flow to the brain. Signs that you may be about to pass out include:  Feeling dizzy or light-headed.  Feeling sick to your stomach (nauseous).  Seeing all white or all black.  Having cold, clammy skin. If you pass out, get help right away. Call your local emergency services (911 in the [place] Do not drive yourself to the hospital. Follow these instructions at home: Watch for any changes in your symptoms. Take these actions to stay safe and help with your symptoms: Lifestyle  Do not  drive, use machinery, or play sports until your doctor says it is okay.  Do not drink alcohol.  Do not use any products that contain nicotine or tobacco, such as cigarettes and e-cigarettes. If you need help quitting, ask your doctor.  Drink enough fluid to keep your pee (urine) pale yellow. General instructions  Take over-the-counter and prescription medicines only as told by your doctor.  If you are taking blood pressure or heart medicine, sit up and stand up slowly. Spend a few minutes getting ready to sit and then stand. This can help you feel less dizzy.  Have someone stay with you until you feel stable.  If you start to feel like you might pass out, lie down  right away and raise (elevate) your feet above the level of your heart. Breathe deeply and steadily. Wait until all of the symptoms are gone.  Keep all follow-up visits as told by your doctor. This is important. Get help right away if:  You have a very bad headache.  You pass out once or more than once.  You have pain in your chest, belly, or back.  You have a very fast or uneven heartbeat (palpitations).  It hurts to breathe.  You are bleeding from your mouth or your bottom (rectum).  You have black or tarry poop (stool).  You have jerky movements that you cannot control (seizure).  You are confused.  You have trouble walking.  You are very weak.  You have vision problems. These symptoms may be an emergency. Do not wait to see if the symptoms will go away. Get medical help right away. Call your local emergency services (911 in the [place] Do not drive yourself to the hospital. Summary  Syncope is when you pass out (faint) for a short time. It is caused by a sudden decrease in blood flow to the brain.  Signs that you may be about to faint include feeling dizzy, light-headed, or sick to your stomach, seeing all white or all black, or having cold, clammy skin.  If you start to feel like you might pass out, lie down right away and raise (elevate) your feet above the level of your heart. Breathe deeply and steadily. Wait until all of the symptoms are gone. This information is not intended to replace advice given to you by your health care provider. Make sure you discuss any questions you have with your health care provider. Document Revised: 12/25/2017 Document Reviewed: 12/25/2017 Elsevier Patient Education  2020 [employer].

## 2020-01-04 NOTE — Progress Notes (Signed)
Occupational Therapy Treatment Patient Details Name: Amanda Sanford MRN: [id number] DOB: [birth year] Today's Date: 01/04/2020    History of present illness Pt is an 86 y.o. female recently admitted to [facility] with COVID-19 PNA (12/12/19-12/17/19), now admitted 12/31/19 with syncopal event witnessed by family. Etiology likely vasovagal versus orthostasis. Head CT negative for acute abnormality. PMH includes advanced Alzheimer's dementia, CHF, HTN.   OT comments  Pt making progress in therapy, demonstrating improved independence with self-care and functional transfer tasks. Pt able to ambulate to/from bathroom with min hand held assist. Noted several instances of loss of balance with pt requiring assist to self-correct. Pt completed toileting task requiring min assist for balance. Pt unable to tolerate standing at the sink due to dizziness, with grooming and sponge bathing tasks completed in sitting. BP after ambulation: 145/49mmHg. Continued education with pt on safety strategies and fall prevention with poor to fair understanding. SpO2 maintained in 90s throughout on room air with no reports of shortness of breath. RN updated. OT will continue to follow acutely.    Follow Up Recommendations  Home health OT;Supervision/Assistance - 24 hour    Equipment Recommendations  3 in 1 bedside commode    Recommendations for Other Services      Precautions / Restrictions Precautions Precautions: Fall;Other (comment) Precaution Comments: Monitor BP. Pt dizzy throughout Restrictions Weight Bearing Restrictions: No       Mobility Bed Mobility Overal bed mobility: Needs Assistance Bed Mobility: Supine to Sit     Supine to sit: HOB elevated;Supervision     General bed mobility comments: Assist to ensure balance and safety  Transfers Overall transfer level: Needs assistance Equipment used: 1 person hand held assist Transfers: Sit to/from Stand;Stand Pivot Transfers Sit to Stand: Min assist Stand pivot  transfers: Min assist       General transfer comment: Pt able to ambulate to/from bathroom with min hand held assist. Noted several instances of LOB with pt requiring assist to self-correct.    Balance Overall balance assessment: Needs assistance Sitting-balance support: No upper extremity supported;Feet supported Sitting balance-Leahy Scale: Good       Standing balance-Leahy Scale: Poor                             ADL either performed or assessed with clinical judgement   ADL Overall ADL's : Needs assistance/impaired Eating/Feeding: Set up Eating/Feeding Details (indicate cue type and reason): Pt ate a good portion of her food this morning without cues. Pt noted to have spilled coffee all over table and bed.  Grooming: Set up;Supervision/safety;Sitting   Upper Body Bathing: Supervision/ safety;Set up;Sitting   Lower Body Bathing: Min guard;Supervison/ safety;Sit to/from stand           Toilet Transfer: Ambulation;Regular Toilet;Grab bars;Minimal assistance Statistician Details (indicate cue type and reason): Assist for balance Toileting- Clothing Manipulation and Hygiene: Min guard;Minimal assistance;Sit to/from stand Toileting - Clothing Manipulation Details (indicate cue type and reason): Assist for balance     Functional mobility during ADLs: Minimal assistance(hand held assist) General ADL Comments: Pt able to ambulate to/from bathroom with min hand held assist. Noted several instances of LOB with pt requiring assist to self-correct. Pt reported dizziness in standing.     Vision       Perception     Praxis      Cognition Arousal/Alertness: Awake/alert Behavior During Therapy: WFL for tasks assessed/performed Overall Cognitive Status: History of cognitive impairments - at baseline  Exercises     Shoulder Instructions       General Comments Pt on room air with SpO2 maintaining in  90s throughout. No reports of shortness of breath. Pt reports dizziness with BP after activity: 145/31mmHg.     Pertinent Vitals/ Pain       Pain Assessment: No/denies pain  Home Living                                          Prior Functioning/Environment              Frequency           Progress Toward Goals  OT Goals(current goals can now be found in the care plan section)  Progress towards OT goals: Progressing toward goals  ADL Goals Pt Will Perform Eating: with supervision;with set-up Pt Will Perform Grooming: with set-up;with supervision;sitting Pt Will Perform Upper Body Bathing: with min assist;sitting Pt Will Transfer to Toilet: with supervision;bedside commode  Plan Discharge plan remains appropriate    Co-evaluation                 AM-PAC OT "6 Clicks" Daily Activity     Outcome Measure   Help from another person eating meals?: A Little Help from another person taking care of personal grooming?: A Little Help from another person toileting, which includes using toliet, bedpan, or urinal?: A Little Help from another person bathing (including washing, rinsing, drying)?: A Little Help from another person to put on and taking off regular upper body clothing?: A Little Help from another person to put on and taking off regular lower body clothing?: A Lot 6 Click Score: 17    End of Session    OT Visit Diagnosis: Unsteadiness on feet (R26.81);Muscle weakness (generalized) (M62.81);Other symptoms and signs involving cognitive function   Activity Tolerance Patient limited by fatigue(Limited due to dizziness)   Patient Left in chair;with call bell/phone within reach;with chair alarm set   Nurse Communication Mobility status        Time: 5681-2751 OT Time Calculation (min): 27 min  Charges: OT General Charges $OT Visit: 1 Visit OT Treatments $Self Care/Home Management : 23-37 mins  Mauri Brooklyn  OTR/L [phone]   Mauri Brooklyn 01/04/2020, 8:47 AM

## 2020-01-05 ENCOUNTER — Encounter (HOSPITAL_COMMUNITY): Payer: Self-pay | Admitting: Pharmacist Clinician (PhC)/ Clinical Pharmacy Specialist

## 2020-01-05 LAB — CULTURE, BLOOD (ROUTINE X 2)
Culture: NO GROWTH
Culture: NO GROWTH

## 2020-02-09 ENCOUNTER — Other Ambulatory Visit: Payer: Self-pay

## 2021-07-27 DEATH — deceased

## 2021-09-26 IMAGING — CT CT HEAD W/O CM
3 series · 16 of 46 positions shown, 19 images · non-contrast
Comparison: 12/12/2019

CLINICAL DATA: Recent syncopal episode

EXAM:
CT HEAD WITHOUT CONTRAST
TECHNIQUE: Contiguous axial images were obtained from the base of the skull
through the vertex without intravenous contrast.

[Series 2: head wo · axial · 0.47mm/px · z∈[+1443,+1563]mm · 10 of 29 slices shown, 13 images]
[im 3/29  brain]
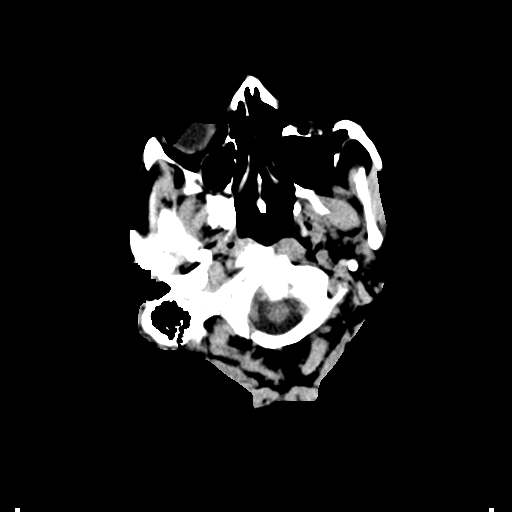
[im 3/29  bone]
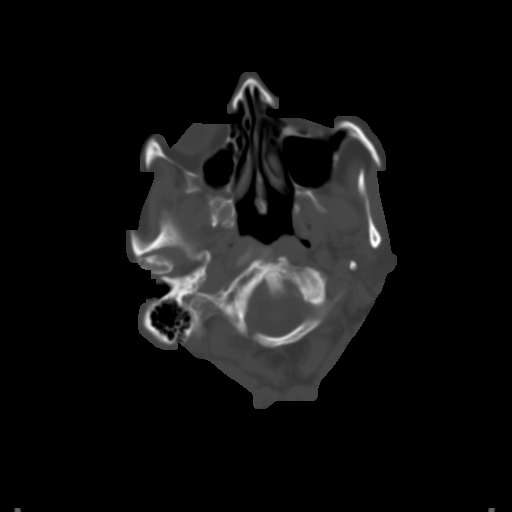
[im 6/29  brain]
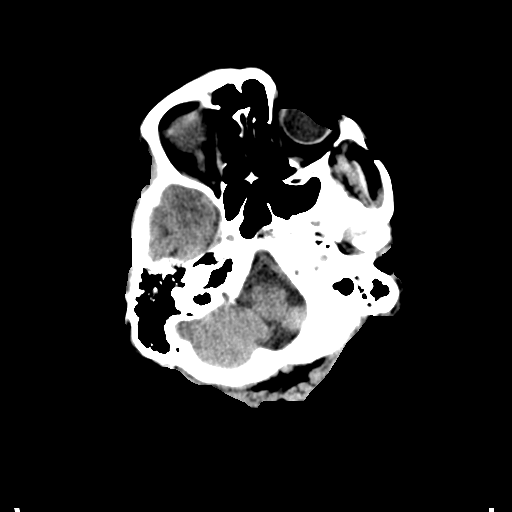
[im 8/29  brain]
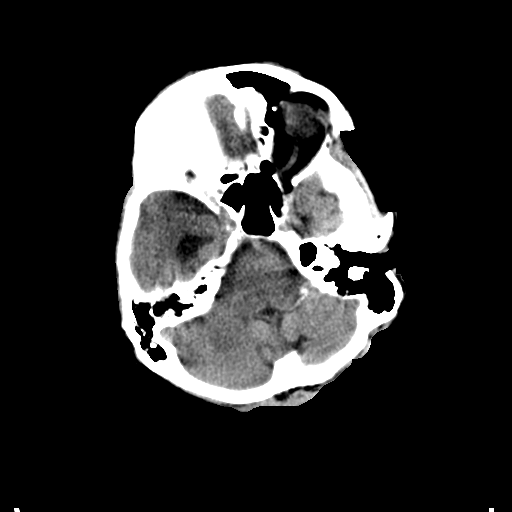
[im 11/29  brain]
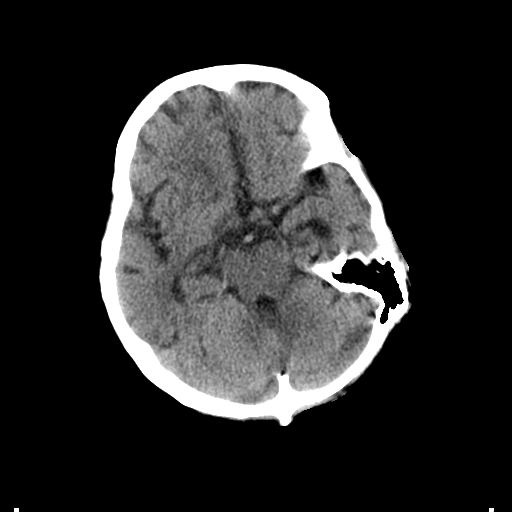
[im 14/29  brain]
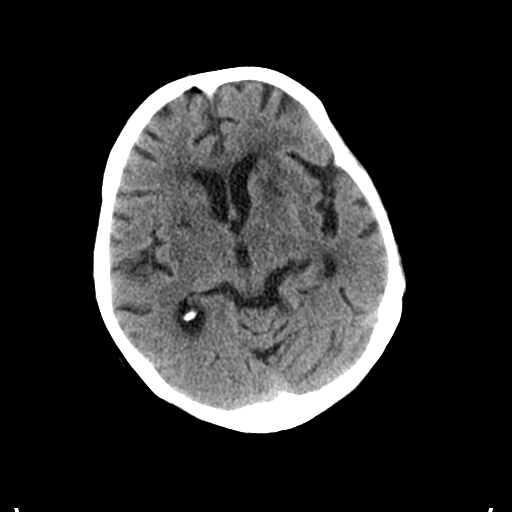
[im 14/29  bone]
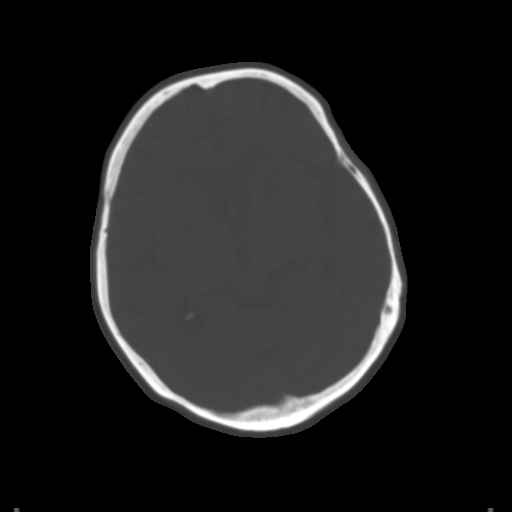
[im 16/29  brain]
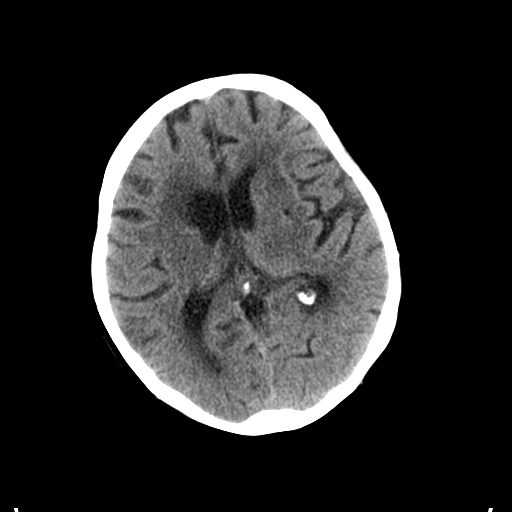
[im 19/29  brain]
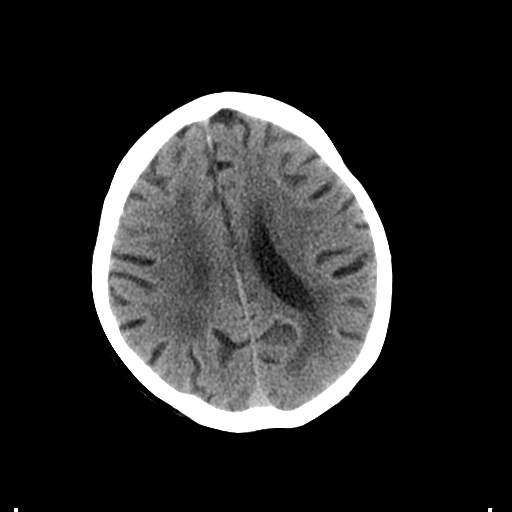
[im 22/29  brain]
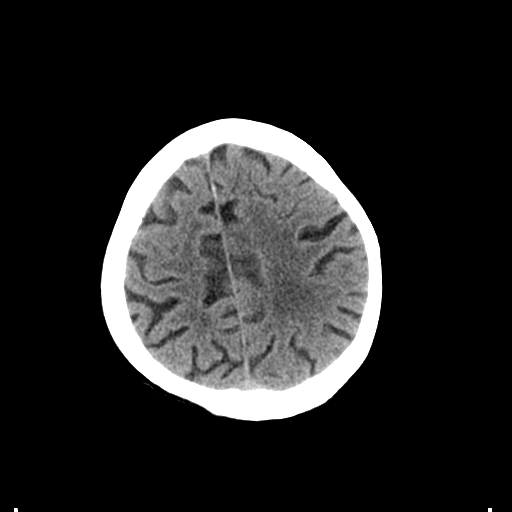
[im 24/29  brain]
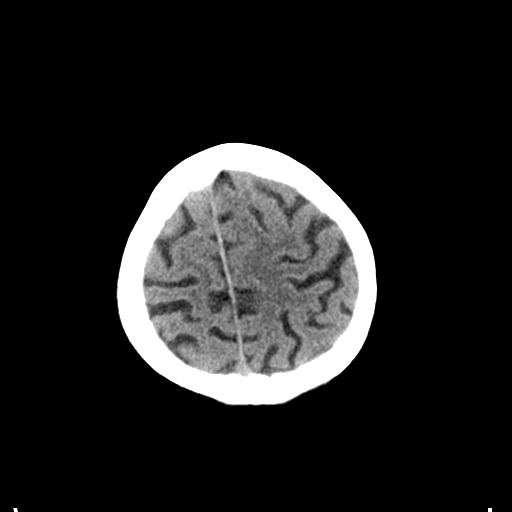
[im 24/29  bone]
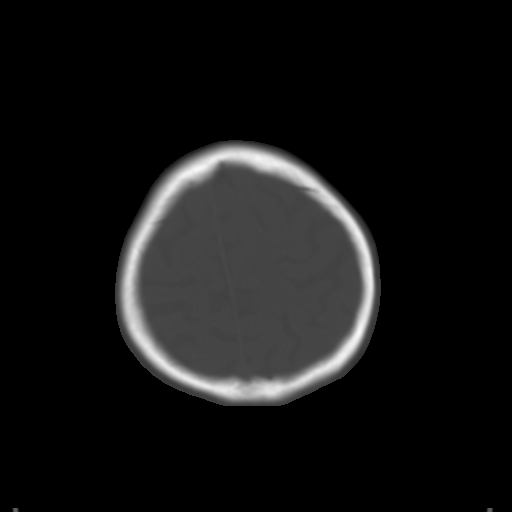
[im 27/29  brain]
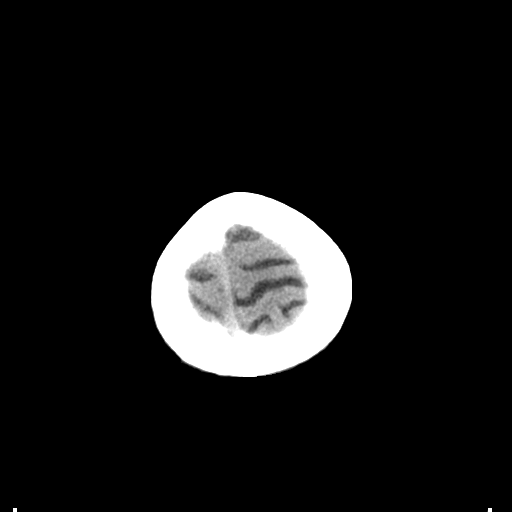

[Series 4: coronal soft tissue · coronal · 0.30mm/px · 3 of 57 slices shown]
[im 19/57  brain]
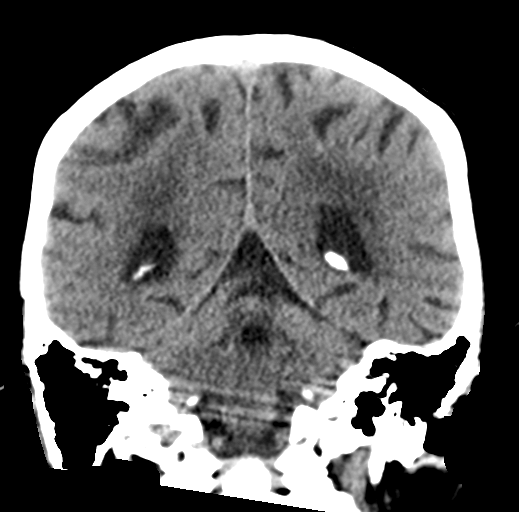
[im 25/57  brain]
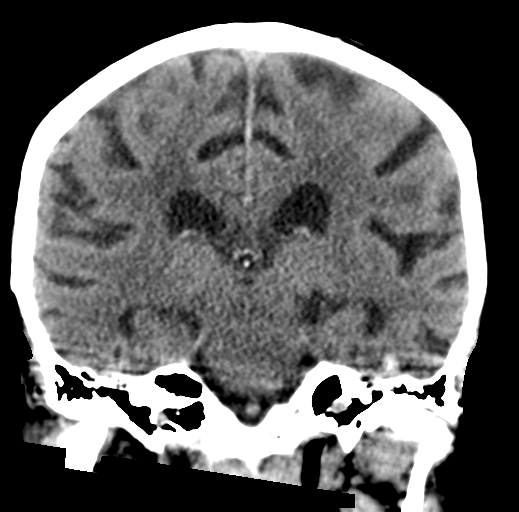
[im 32/57  brain]
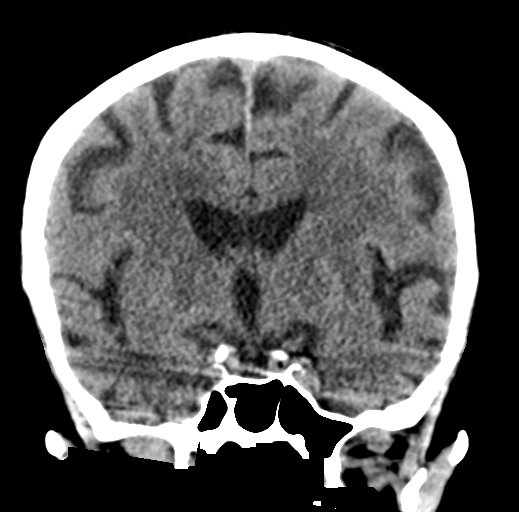

[Series 5: sagittal soft tissue · sagittal · 0.30mm/px · 3 of 52 slices shown]
[im 20/52  brain]
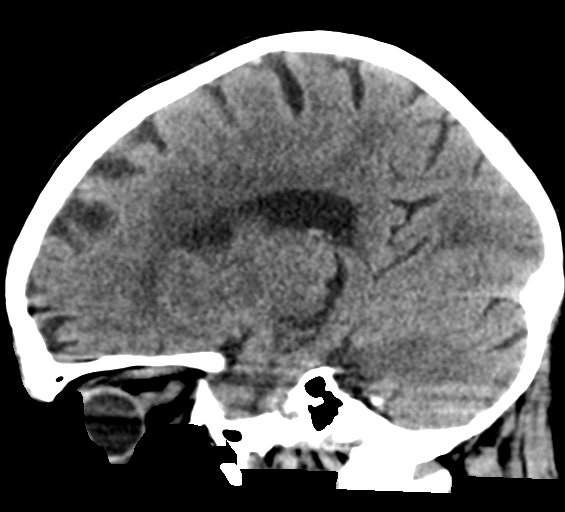
[im 26/52  brain]
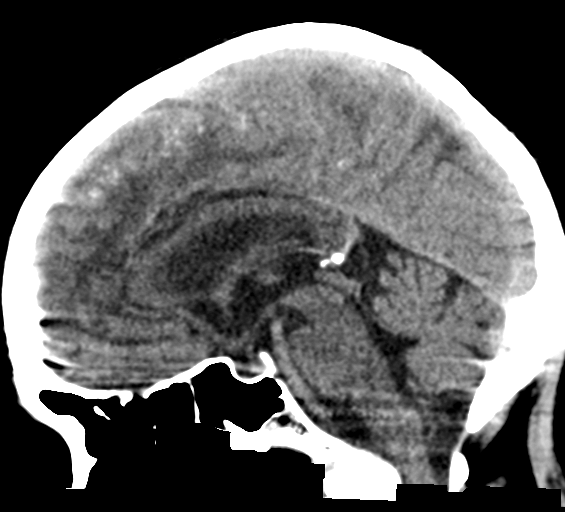
[im 32/52  brain]
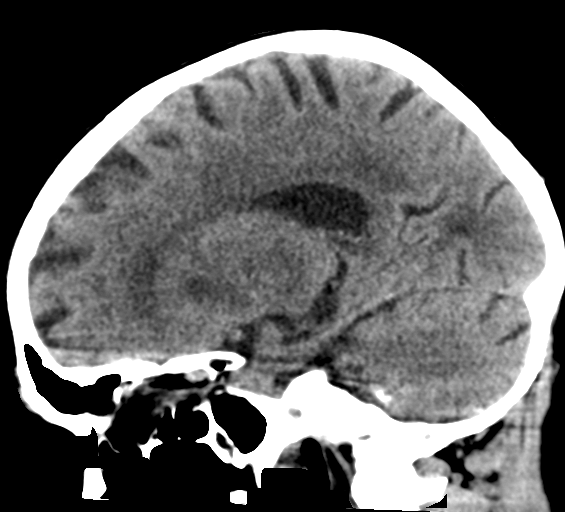

[16 of 46 positions shown; findings below may reference images not displayed]

FINDINGS: Brain: Mild atrophic changes and chronic white matter ischemic
changes are seen. These are similar to that noted on the prior CT
examination. Multiple prior lacunar infarcts are seen and stable. No
findings to suggest acute hemorrhage, acute infarction or
space-occupying mass lesion are noted.

Vascular: No hyperdense vessel or unexpected calcification.

Skull: Normal. Negative for fracture or focal lesion.

Sinuses/Orbits: No acute finding.

Other: None.
IMPRESSION: Chronic atrophic and ischemic changes similar to that noted on the
prior exam. No acute abnormality is noted.
